# Patient Record
Sex: Female | Born: 1990 | Race: Black or African American | Hispanic: No | Marital: Single | Smoking: Current some day smoker
Health system: Southern US, Community
[De-identification: ages and names within clinical notes are randomized; demographics above are authoritative.]

## PROBLEM LIST (undated history)

## (undated) DIAGNOSIS — D649 Anemia, unspecified: Secondary | ICD-10-CM

## (undated) HISTORY — PX: ANKLE SURGERY: SHX546

---

## 2012-10-05 ENCOUNTER — Encounter (HOSPITAL_COMMUNITY): Payer: Self-pay | Admitting: *Deleted

## 2012-10-05 DIAGNOSIS — G8929 Other chronic pain: Secondary | ICD-10-CM | POA: Insufficient documentation

## 2012-10-05 DIAGNOSIS — D649 Anemia, unspecified: Secondary | ICD-10-CM | POA: Insufficient documentation

## 2012-10-05 DIAGNOSIS — R109 Unspecified abdominal pain: Secondary | ICD-10-CM | POA: Insufficient documentation

## 2012-10-05 DIAGNOSIS — Z3202 Encounter for pregnancy test, result negative: Secondary | ICD-10-CM | POA: Insufficient documentation

## 2012-10-05 DIAGNOSIS — Z79899 Other long term (current) drug therapy: Secondary | ICD-10-CM | POA: Insufficient documentation

## 2012-10-05 DIAGNOSIS — N898 Other specified noninflammatory disorders of vagina: Secondary | ICD-10-CM | POA: Insufficient documentation

## 2012-10-05 LAB — CBC WITH DIFFERENTIAL/PLATELET
Basophils Absolute: 0 10*3/uL (ref 0.0–0.1)
Eosinophils Absolute: 0.1 10*3/uL (ref 0.0–0.7)
Hemoglobin: 10.4 g/dL — ABNORMAL LOW (ref 12.0–15.0)
Lymphocytes Relative: 45 % (ref 12–46)
MCHC: 34 g/dL (ref 30.0–36.0)
Neutrophils Relative %: 47 % (ref 43–77)
RDW: 16.9 % — ABNORMAL HIGH (ref 11.5–15.5)
WBC: 7 10*3/uL (ref 4.0–10.5)

## 2012-10-05 LAB — URINALYSIS, ROUTINE W REFLEX MICROSCOPIC
Glucose, UA: NEGATIVE mg/dL
Protein, ur: 100 mg/dL — AB
Specific Gravity, Urine: 1.031 — ABNORMAL HIGH (ref 1.005–1.030)
Urobilinogen, UA: 1 mg/dL (ref 0.0–1.0)

## 2012-10-05 LAB — COMPREHENSIVE METABOLIC PANEL
ALT: 12 U/L (ref 0–35)
AST: 16 U/L (ref 0–37)
Albumin: 3.4 g/dL — ABNORMAL LOW (ref 3.5–5.2)
Alkaline Phosphatase: 68 U/L (ref 39–117)
Potassium: 3.8 mEq/L (ref 3.5–5.1)
Sodium: 140 mEq/L (ref 135–145)
Total Protein: 6.9 g/dL (ref 6.0–8.3)

## 2012-10-05 LAB — URINE MICROSCOPIC-ADD ON

## 2012-10-05 LAB — PREGNANCY, URINE: Preg Test, Ur: NEGATIVE

## 2012-10-05 NOTE — ED Notes (Signed)
The pt has had vaginal bleeding for 7 months and she has been worked up for the same .  She just moved here 3 weeks ago.   Some nausea with abd pain.  lmp now

## 2012-10-06 ENCOUNTER — Emergency Department (HOSPITAL_COMMUNITY)
Admission: EM | Admit: 2012-10-06 | Discharge: 2012-10-06 | Disposition: A | Discharge status: Home / Self Care | Payer: Medicaid Other | Attending: Emergency Medicine | Admitting: Emergency Medicine

## 2012-10-06 DIAGNOSIS — D649 Anemia, unspecified: Secondary | ICD-10-CM

## 2012-10-06 DIAGNOSIS — R109 Unspecified abdominal pain: Secondary | ICD-10-CM

## 2012-10-06 DIAGNOSIS — N939 Abnormal uterine and vaginal bleeding, unspecified: Secondary | ICD-10-CM

## 2012-10-06 HISTORY — DX: Anemia, unspecified: D64.9

## 2012-10-06 MED ORDER — TRAMADOL HCL 50 MG PO TABS: 50.0000 mg | ORAL_TABLET | Freq: Four times a day (QID) | ORAL | Status: DC | PRN

## 2012-10-06 NOTE — ED Provider Notes (Signed)
Medical screening examination/treatment/procedure(s) were performed by non-physician practitioner and as supervising physician I was immediately available for consultation/collaboration.  Jones Skene, M.D.   Jones Skene, MD 10/06/12 [phone]

## 2012-10-06 NOTE — ED Provider Notes (Signed)
History     CSN: [id number]  Arrival date & time 10/05/12  2215   First MD Initiated Contact with Patient 10/06/12 0316      Chief Complaint  Patient presents with  . Vaginal Bleeding    (Consider location/radiation/quality/duration/timing/severity/associated sxs/prior treatment) HPI Comments: Patient has had vaginal bleeding for the past 7 months.  She has had multiple visits with her OB/GYN doctor and asked for old, [place].  It's been recommended that she get a D&C.  She's been tried on metformin which she will not take because she doesn't like the way.  It makes her feel and she can't remember to take it on a daily basis.  She's been tried on hormone therapy, which she, says makes her feel bad, so she will not take fact, she has pain in her left side.  That shoots up.  She had pain in her right side that shoots up into her right breast and into her back.  This pain has been there for the past 7 months intermittently waxing and waning in intensity.  She recently moved to [place].  3, weeks, ago, plans on establishing with Dr. Deidre Ala.  She, states she's had her medical records sent to him.  Tonight presents to the emergency department, with her chronic pain, not worse or better than in the past, but is having a difficult time sleeping tonight.  She states, that she's tried Tylenol, Advil, and ibuprofen, without any resolution of the pain.  She does take iron tablets for her anemia. She denies any cough, fever, chills, dysuria, constipation, diarrhea, sore throat.  Respiratory illness of any kind  Patient is a 22 y.o. female presenting with vaginal bleeding. The history is provided by the patient.  Vaginal Bleeding This is a chronic problem. The current episode started more than 1 month ago. The problem occurs constantly. The problem has been waxing and waning. Associated symptoms include abdominal pain. Pertinent negatives include no change in bowel habit, chest pain, chills,  congestion, coughing, fever, myalgias, nausea, rash or vomiting. Nothing aggravates the symptoms. She has tried NSAIDs for the symptoms. The treatment provided no relief.    Past Medical History  Diagnosis Date  . Anemia     History reviewed. No pertinent past surgical history.  No family history on file.  History  Substance Use Topics  . Smoking status: Never Smoker   . Smokeless tobacco: Not on file  . Alcohol Use: No    OB History   Grav Para Term Preterm Abortions TAB SAB Ect Mult Living                  Review of Systems  Constitutional: Negative for fever and chills.  HENT: Negative for congestion and rhinorrhea.   Respiratory: Negative for cough, shortness of breath and wheezing.   Cardiovascular: Negative for chest pain, palpitations and leg swelling.  Gastrointestinal: Positive for abdominal pain. Negative for nausea, vomiting, diarrhea, constipation and change in bowel habit.  Genitourinary: Positive for vaginal bleeding. Negative for dysuria, vaginal discharge and vaginal pain.  Musculoskeletal: Negative for myalgias.  Skin: Negative for rash.  Neurological: Negative for dizziness.  All other systems reviewed and are negative.    Allergies  Review of patient's allergies indicates no known allergies.  Home Medications   Current Outpatient Rx  Name  Route  Sig  Dispense  Refill  . ibuprofen (ADVIL,MOTRIN) 200 MG tablet   Oral   Take 200 mg by mouth every 6 (six) hours  as needed for pain.         . IRON PO   Oral   Take 1 tablet by mouth daily.         . traMADol (ULTRAM) 50 MG tablet   Oral   Take 1 tablet (50 mg total) by mouth every 6 (six) hours as needed for pain.   15 tablet   0     BP 129/75  Pulse 84  Temp(Src) 98.5 F (36.9 C)  Resp 20  SpO2 98%  LMP 10/05/2012  Physical Exam  Nursing note and vitals reviewed. Constitutional: She is oriented to person, place, and time. She appears well-developed and well-nourished. No  distress.  Morbidly obese, in no distress  HENT:  Head: Normocephalic and atraumatic.  Eyes: Pupils are equal, round, and reactive to light.  Neck: Normal range of motion.  Cardiovascular: Normal rate and regular rhythm.   Pulmonary/Chest: Effort normal and breath sounds normal.  Abdominal: Soft. She exhibits no distension. There is no tenderness.  Morbidly obese  Musculoskeletal: She exhibits no edema.  Neurological: She is alert and oriented to person, place, and time.  Skin: Skin is warm.    ED Course  Procedures (including critical care time)  Labs Reviewed  URINALYSIS, ROUTINE W REFLEX MICROSCOPIC - Abnormal; Notable for the following:    Color, Urine RED (*)    APPearance TURBID (*)    Specific Gravity, Urine 1.031 (*)    Hgb urine dipstick LARGE (*)    Bilirubin Urine SMALL (*)    Ketones, ur 15 (*)    Protein, ur 100 (*)    Leukocytes, UA SMALL (*)    All other components within normal limits  CBC WITH DIFFERENTIAL - Abnormal; Notable for the following:    Hemoglobin 10.4 (*)    HCT 30.6 (*)    MCV 65.4 (*)    MCH 22.2 (*)    RDW 16.9 (*)    All other components within normal limits  COMPREHENSIVE METABOLIC PANEL - Abnormal; Notable for the following:    Albumin 3.4 (*)    Total Bilirubin 0.1 (*)    All other components within normal limits  URINE MICROSCOPIC-ADD ON - Abnormal; Notable for the following:    Squamous Epithelial / LPF FEW (*)    Bacteria, UA FEW (*)    All other components within normal limits  URINE CULTURE  PREGNANCY, URINE   No results found.   1. Abnormal vaginal bleeding   2. Anemia   3. Abdominal pain       MDM   Patient has no new symptoms, along with her abnormal vaginal bleeding for the past 7 months.  She is taking iron supplement her anemia today.  Her hemoglobin and hematocrit are 10 point 4 and 30.6.  Her urine does not show any sign of infection.  She is not tachycardic or tachypnea.  She has an appointment with Dr.  Excell Seltzer to establish primary care and referred her to [facility] hospital.  Clinic, as well        Arman Filter, NP 10/06/12 0353  Arman Filter, NP 10/06/12 [phone]

## 2012-10-07 LAB — URINE CULTURE: Colony Count: 9000

## 2012-11-12 ENCOUNTER — Encounter (HOSPITAL_COMMUNITY): Payer: Self-pay | Admitting: Emergency Medicine

## 2012-11-12 ENCOUNTER — Emergency Department (HOSPITAL_COMMUNITY)
Admission: EM | Admit: 2012-11-12 | Discharge: 2012-11-13 | Disposition: A | Discharge status: Home / Self Care | Payer: Medicaid Other | Attending: Emergency Medicine | Admitting: Emergency Medicine

## 2012-11-12 DIAGNOSIS — N898 Other specified noninflammatory disorders of vagina: Secondary | ICD-10-CM | POA: Insufficient documentation

## 2012-11-12 DIAGNOSIS — Z79899 Other long term (current) drug therapy: Secondary | ICD-10-CM | POA: Insufficient documentation

## 2012-11-12 DIAGNOSIS — N949 Unspecified condition associated with female genital organs and menstrual cycle: Secondary | ICD-10-CM | POA: Insufficient documentation

## 2012-11-12 DIAGNOSIS — E669 Obesity, unspecified: Secondary | ICD-10-CM | POA: Insufficient documentation

## 2012-11-12 DIAGNOSIS — D649 Anemia, unspecified: Secondary | ICD-10-CM | POA: Insufficient documentation

## 2012-11-12 DIAGNOSIS — G8929 Other chronic pain: Secondary | ICD-10-CM | POA: Insufficient documentation

## 2012-11-12 DIAGNOSIS — N920 Excessive and frequent menstruation with regular cycle: Secondary | ICD-10-CM | POA: Insufficient documentation

## 2012-11-12 DIAGNOSIS — Z3202 Encounter for pregnancy test, result negative: Secondary | ICD-10-CM | POA: Insufficient documentation

## 2012-11-12 DIAGNOSIS — R109 Unspecified abdominal pain: Secondary | ICD-10-CM | POA: Insufficient documentation

## 2012-11-12 LAB — WET PREP, GENITAL

## 2012-11-12 LAB — BASIC METABOLIC PANEL
BUN: 9 mg/dL (ref 6–23)
Chloride: 103 mEq/L (ref 96–112)
Creatinine, Ser: 0.79 mg/dL (ref 0.50–1.10)
GFR calc Af Amer: >90 mL/min (ref >90)

## 2012-11-12 LAB — URINALYSIS, ROUTINE W REFLEX MICROSCOPIC
Bilirubin Urine: NEGATIVE
Glucose, UA: NEGATIVE mg/dL
Hgb urine dipstick: NEGATIVE
Ketones, ur: NEGATIVE mg/dL
Nitrite: NEGATIVE
pH: 5.5 (ref 5.0–8.0)

## 2012-11-12 LAB — CBC
MCH: 21.1 pg — ABNORMAL LOW (ref 26.0–34.0)
MCV: 67.4 fL — ABNORMAL LOW (ref 78.0–100.0)
Platelets: 295 10*3/uL (ref 150–400)
RBC: 4.7 MIL/uL (ref 3.87–5.11)

## 2012-11-12 MED ORDER — OXYCODONE-ACETAMINOPHEN 5-325 MG PO TABS
2.0000 | ORAL_TABLET | Freq: Once | ORAL | Status: AC
  Administered 2012-11-12: 2 via ORAL
  Filled 2012-11-12: qty 2

## 2012-11-12 NOTE — ED Provider Notes (Signed)
History     CSN: [id number]  Arrival date & time 11/12/12  2206   First MD Initiated Contact with Patient 11/12/12 2228      Chief Complaint  Patient presents with  . Vaginal Bleeding  . Abdominal Pain    (Consider location/radiation/quality/duration/timing/severity/associated sxs/prior treatment) The history is provided by the patient and medical records. No language interpreter was used.    Meghan Edwards is a 22 y.o. female  with a hx of vaginal bleeding for the last 8 months with chronic associated abdominal pain presents to the Emergency Department complaining of persistent, vaginal bleeding and abdominal pain with an acute worsening tonight after lifting boxes at her work.  Patient states she seen her OB/GYN multiple times who has recommended a D&C, metformin and hormone therapy none of which she wants.  She states the metformin and hormone therapy make her feel poorly. She states the abdominal pain radiates from her pelvis and her back and is constant in nature waxing and waning in intensity.  Nothing makes her pain better and movement and stress activity make it worse. She states she does take iron tablets for her chronic anemia. She denies fever, chills, headache, neck pain, chest pain, shortness of breath, nausea, vomiting, diarrhea, constipation, dysuria.   Patient states her pain is the same as it has been and has not changed in nature, but it has increased in intensity.  Past Medical History  Diagnosis Date  . Anemia     Past Surgical History  Procedure Laterality Date  . Ankle surgery      Family History  Problem Relation Age of Onset  . Hypertension Other   . Diabetes Other   . Stroke Other     History  Substance Use Topics  . Smoking status: Never Smoker   . Smokeless tobacco: Not on file  . Alcohol Use: No    OB History   Grav Para Term Preterm Abortions TAB SAB Ect Mult Living                  Review of Systems  Constitutional: Negative for fever,  diaphoresis, appetite change, fatigue and unexpected weight change.  HENT: Negative for mouth sores, trouble swallowing, neck pain and neck stiffness.   Respiratory: Negative for cough, chest tightness, shortness of breath, wheezing and stridor.   Cardiovascular: Negative for chest pain and palpitations.  Gastrointestinal: Positive for abdominal pain. Negative for nausea, vomiting, diarrhea, constipation, blood in stool, abdominal distention and rectal pain.  Genitourinary: Positive for vaginal bleeding and pelvic pain. Negative for dysuria, urgency, frequency, hematuria, flank pain, decreased urine volume, vaginal discharge, difficulty urinating and vaginal pain.  Musculoskeletal: Negative for back pain.  Skin: Negative for rash.  Neurological: Negative for weakness.  Hematological: Negative for adenopathy.  Psychiatric/Behavioral: Negative for confusion.  All other systems reviewed and are negative.    Allergies  Review of patient's allergies indicates no known allergies.  Home Medications  No current outpatient prescriptions on file.  BP 138/81  Pulse 86  Temp(Src) 98.8 F (37.1 C) (Oral)  Resp 16  Ht 5\' 4"  (1.626 m)  Wt 314 lb (142.429 kg)  BMI 53.87 kg/m2  SpO2 98%  Physical Exam  Nursing note and vitals reviewed. Constitutional: She is oriented to person, place, and time. She appears well-developed and well-nourished. No distress.  HENT:  Head: Normocephalic and atraumatic.  Mouth/Throat: Oropharynx is clear and moist.  Eyes: Conjunctivae are normal. Pupils are equal, round, and reactive to light. No  scleral icterus.  Neck: Normal range of motion. Neck supple.  Cardiovascular: Normal rate, regular rhythm, normal heart sounds and intact distal pulses.   No murmur heard. Pulmonary/Chest: Effort normal and breath sounds normal. No respiratory distress. She has no wheezes.  Abdominal: Soft. Normal appearance and bowel sounds are normal. She exhibits no distension and no  mass. There is no hepatosplenomegaly. There is tenderness in the right lower quadrant, suprapubic area and left lower quadrant. There is no rigidity, no rebound, no guarding, no CVA tenderness, no tenderness at McBurney's point and negative Murphy's sign. Hernia confirmed negative in the right inguinal area and confirmed negative in the left inguinal area.    Obese  Genitourinary: Uterus normal. Pelvic exam was performed with patient supine. There is no rash, tenderness or lesion on the right labia. There is no rash, tenderness or lesion on the left labia. Uterus is not deviated, not enlarged, not fixed and not tender. Cervix exhibits no motion tenderness and no friability. Discharge: mucous  Right adnexum displays no mass, no tenderness and no fullness. Left adnexum displays no mass, no tenderness and no fullness. There is bleeding around the vagina. No erythema or tenderness around the vagina. No foreign body around the vagina. No signs of injury around the vagina. No vaginal discharge found.  Musculoskeletal: Normal range of motion. She exhibits no edema.  Lymphadenopathy:    She has no cervical adenopathy.       Right: No inguinal adenopathy present.       Left: No inguinal adenopathy present.  Neurological: She is alert and oriented to person, place, and time. She exhibits normal muscle tone. Coordination normal.  Speech is clear and goal oriented Moves extremities without ataxia  Skin: Skin is warm and dry. No rash noted. She is not diaphoretic.  Psychiatric: She has a normal mood and affect.    ED Course  Procedures (including critical care time)  Labs Reviewed  WET PREP, GENITAL - Abnormal; Notable for the following:    Clue Cells Wet Prep HPF POC RARE (*)    WBC, Wet Prep HPF POC FEW (*)    All other components within normal limits  CBC - Abnormal; Notable for the following:    Hemoglobin 9.9 (*)    HCT 31.7 (*)    MCV 67.4 (*)    MCH 21.1 (*)    RDW 17.3 (*)    All other  components within normal limits  URINALYSIS, ROUTINE W REFLEX MICROSCOPIC - Abnormal; Notable for the following:    Specific Gravity, Urine 1.031 (*)    All other components within normal limits  GC/CHLAMYDIA PROBE AMP  PREGNANCY, URINE  BASIC METABOLIC PANEL   No results found.   1. Chronic abdominal pain   2. Menorrhagia       MDM  Maelie Chriswell presents with chronic abdominal pain and vaginal bleeding. Pt with hx of same in the past and only treating the anemia.  Patient is nontoxic, nonseptic appearing, in no apparent distress.  Patient's pain and other symptoms adequately managed in emergency department.  Labs, imaging vitals reviewed; I do not believe imaging is warranted at this time.  Patient does not meet the SIRS or Sepsis criteria.  On repeat exam patient does not have a surgical abdomin and there are nor peritoneal signs.  No indication of appendicitis, bowel obstruction, bowel perforation, cholecystitis, diverticulitis, PID or ectopic pregnancy.  Patient discharged home with strict instructions for follow-up with their primary care physician and OB/GYN.  I have also discussed reasons to return immediately to the ER.  Patient expresses understanding and agrees with plan.  Dahlia Client Elridge Stemm, PA-C 11/13/12 [phone]

## 2012-11-12 NOTE — ED Notes (Signed)
Pt reports having vaginal bleeding, back pain, and abd pain for the past 8 months  Pt states tonight at work she was lifting heavy boxes and now the pain is unbearable

## 2012-11-13 LAB — GC/CHLAMYDIA PROBE AMP
CT Probe RNA: NEGATIVE
GC Probe RNA: NEGATIVE

## 2012-11-15 NOTE — ED Provider Notes (Signed)
Medical screening examination/treatment/procedure(s) were performed by non-physician practitioner and as supervising physician I was immediately available for consultation/collaboration.   Onalee Steinbach R Hansini Clodfelter, MD 11/15/12 1510 

## 2013-01-01 ENCOUNTER — Ambulatory Visit (INDEPENDENT_AMBULATORY_CARE_PROVIDER_SITE_OTHER): Payer: Medicaid Other | Admitting: Obstetrics

## 2013-01-01 ENCOUNTER — Encounter: Payer: Self-pay | Admitting: Obstetrics

## 2013-01-01 VITALS — BP 132/82 | HR 71 | Temp 98.2°F | Ht 65.0 in | Wt 311.0 lb

## 2013-01-01 DIAGNOSIS — Z0000 Encounter for general adult medical examination without abnormal findings: Secondary | ICD-10-CM

## 2013-01-01 DIAGNOSIS — D649 Anemia, unspecified: Secondary | ICD-10-CM

## 2013-01-01 DIAGNOSIS — Z3009 Encounter for other general counseling and advice on contraception: Secondary | ICD-10-CM

## 2013-01-01 DIAGNOSIS — Z3202 Encounter for pregnancy test, result negative: Secondary | ICD-10-CM

## 2013-01-01 DIAGNOSIS — Z113 Encounter for screening for infections with a predominantly sexual mode of transmission: Secondary | ICD-10-CM

## 2013-01-01 LAB — CBC
MCHC: 31.8 g/dL (ref 30.0–36.0)
Platelets: 349 10*3/uL (ref 150–400)
RDW: 17.8 % — ABNORMAL HIGH (ref 11.5–15.5)

## 2013-01-01 LAB — COMPREHENSIVE METABOLIC PANEL
AST: 14 U/L (ref 0–37)
Albumin: 4 g/dL (ref 3.5–5.2)
Alkaline Phosphatase: 61 U/L (ref 39–117)
Potassium: 4.2 mEq/L (ref 3.5–5.3)
Sodium: 138 mEq/L (ref 135–145)
Total Bilirubin: 0.3 mg/dL (ref 0.3–1.2)
Total Protein: 6.7 g/dL (ref 6.0–8.3)

## 2013-01-01 LAB — POCT URINE PREGNANCY: Preg Test, Ur: NEGATIVE

## 2013-01-01 MED ORDER — VITAFOL ULTRA 29-0.6-0.4-200 MG PO CAPS: 1.0000 | ORAL_CAPSULE | Freq: Every day | ORAL | Status: DC

## 2013-01-01 MED ORDER — MEDROXYPROGESTERONE ACETATE 150 MG/ML IM SUSP: 150.0000 mg | INTRAMUSCULAR | Status: DC

## 2013-01-01 NOTE — Progress Notes (Signed)
.   Subjective:     Meghan Edwards is a 22 y.o. female here for a routine exam.  Current complaints: Patient states that she had an abortion at the beginning of the year and has been bleeding everyday since.   Personal health questionnaire reviewed: yes.   Gynecologic History No LMP recorded. - Patient states that she has been bleeding everyday since January. Contraception: none Last Pap: N/A Last mammogram: N/A  Obstetric History OB History   Grav Para Term Preterm Abortions TAB SAB Ect Mult Living                   The following portions of the patient's history were reviewed and updated as appropriate: allergies, current medications, past family history, past medical history, past social history, past surgical history and problem list.  Review of Systems Pertinent items are noted in HPI.    Objective:    General appearance: alert and no distress Abdomen: normal findings: soft, non-tender Pelvic: cervix normal in appearance, external genitalia normal, no adnexal masses or tenderness, no cervical motion tenderness, uterus normal size, shape, and consistency and vagina normal without discharge    Assessment:    Healthy female exam.  Contraceptive counseling.   Plan:    Education reviewed: Contraceptive options. Contraception: none. Follow up in: 2 weeks. Depo Provera Rx.  Will RTC in 2 weeks for Depo injection after 2 weeks of abstinence and negative UPT.

## 2013-01-02 LAB — GC/CHLAMYDIA PROBE AMP
CT Probe RNA: NEGATIVE
GC Probe RNA: NEGATIVE

## 2013-01-02 LAB — PAP IG W/ RFLX HPV ASCU

## 2013-01-02 LAB — WET PREP BY MOLECULAR PROBE
Gardnerella vaginalis: POSITIVE — AB
Trichomonas vaginosis: NEGATIVE

## 2013-01-06 ENCOUNTER — Other Ambulatory Visit: Payer: Self-pay | Admitting: *Deleted

## 2013-01-06 MED ORDER — METRONIDAZOLE 500 MG PO TABS: 500.0000 mg | ORAL_TABLET | Freq: Two times a day (BID) | ORAL | Status: DC

## 2013-01-12 ENCOUNTER — Ambulatory Visit: Payer: Medicaid Other

## 2013-01-16 ENCOUNTER — Ambulatory Visit (INDEPENDENT_AMBULATORY_CARE_PROVIDER_SITE_OTHER): Payer: Medicaid Other | Admitting: *Deleted

## 2013-01-16 VITALS — BP 123/84 | HR 85 | Temp 98.4°F | Ht 64.0 in | Wt 317.6 lb

## 2013-01-16 DIAGNOSIS — Z3202 Encounter for pregnancy test, result negative: Secondary | ICD-10-CM

## 2013-01-16 DIAGNOSIS — Z309 Encounter for contraceptive management, unspecified: Secondary | ICD-10-CM

## 2013-01-16 DIAGNOSIS — IMO0001 Reserved for inherently not codable concepts without codable children: Secondary | ICD-10-CM

## 2013-01-16 MED ORDER — MEDROXYPROGESTERONE ACETATE 150 MG/ML IM SUSP
150.0000 mg | INTRAMUSCULAR | Status: AC
  Administered 2013-01-16: 150 mg via INTRAMUSCULAR

## 2013-01-16 NOTE — Progress Notes (Signed)
Patient is here today for her Depo injection.  Second UPT is negative.  Patient tolerated well and was advised to RTO for next injection 04/09/13.

## 2013-01-19 ENCOUNTER — Ambulatory Visit: Payer: Medicaid Other

## 2013-02-14 ENCOUNTER — Emergency Department (HOSPITAL_COMMUNITY)
Admission: EM | Admit: 2013-02-14 | Discharge: 2013-02-14 | Disposition: A | Discharge status: Home / Self Care | Payer: Medicaid Other | Attending: Emergency Medicine | Admitting: Emergency Medicine

## 2013-02-14 ENCOUNTER — Encounter (HOSPITAL_COMMUNITY): Payer: Self-pay | Admitting: Emergency Medicine

## 2013-02-14 DIAGNOSIS — N739 Female pelvic inflammatory disease, unspecified: Secondary | ICD-10-CM | POA: Insufficient documentation

## 2013-02-14 DIAGNOSIS — D649 Anemia, unspecified: Secondary | ICD-10-CM | POA: Insufficient documentation

## 2013-02-14 DIAGNOSIS — Z3202 Encounter for pregnancy test, result negative: Secondary | ICD-10-CM | POA: Insufficient documentation

## 2013-02-14 DIAGNOSIS — G8929 Other chronic pain: Secondary | ICD-10-CM | POA: Insufficient documentation

## 2013-02-14 DIAGNOSIS — N760 Acute vaginitis: Secondary | ICD-10-CM

## 2013-02-14 DIAGNOSIS — N949 Unspecified condition associated with female genital organs and menstrual cycle: Secondary | ICD-10-CM | POA: Insufficient documentation

## 2013-02-14 DIAGNOSIS — R109 Unspecified abdominal pain: Secondary | ICD-10-CM | POA: Insufficient documentation

## 2013-02-14 DIAGNOSIS — Z79899 Other long term (current) drug therapy: Secondary | ICD-10-CM | POA: Insufficient documentation

## 2013-02-14 LAB — CBC WITH DIFFERENTIAL/PLATELET
Basophils Absolute: 0 10*3/uL (ref 0.0–0.1)
Basophils Relative: 0 % (ref 0–1)
Eosinophils Relative: 2 % (ref 0–5)
HCT: 33.1 % — ABNORMAL LOW (ref 36.0–46.0)
MCHC: 33.2 g/dL (ref 30.0–36.0)
MCV: 66.3 fL — ABNORMAL LOW (ref 78.0–100.0)
Monocytes Absolute: 0.3 10*3/uL (ref 0.1–1.0)
RDW: 16.5 % — ABNORMAL HIGH (ref 11.5–15.5)

## 2013-02-14 LAB — URINALYSIS, ROUTINE W REFLEX MICROSCOPIC
Glucose, UA: NEGATIVE mg/dL
Ketones, ur: 15 mg/dL — AB
Leukocytes, UA: NEGATIVE
Protein, ur: 30 mg/dL — AB
Urobilinogen, UA: 0.2 mg/dL (ref 0.0–1.0)

## 2013-02-14 LAB — BASIC METABOLIC PANEL
BUN: 7 mg/dL (ref 6–23)
CO2: 23 mEq/L (ref 19–32)
Calcium: 8.7 mg/dL (ref 8.4–10.5)
Chloride: 105 mEq/L (ref 96–112)
Creatinine, Ser: 0.73 mg/dL (ref 0.50–1.10)
GFR calc Af Amer: >90 mL/min (ref >90)

## 2013-02-14 LAB — URINE MICROSCOPIC-ADD ON

## 2013-02-14 MED ORDER — KETOROLAC TROMETHAMINE 60 MG/2ML IM SOLN
60.0000 mg | Freq: Once | INTRAMUSCULAR | Status: AC
  Administered 2013-02-14: 60 mg via INTRAMUSCULAR
  Filled 2013-02-14: qty 2

## 2013-02-14 MED ORDER — TRAMADOL HCL 50 MG PO TABS: 50.0000 mg | ORAL_TABLET | Freq: Four times a day (QID) | ORAL | Status: DC | PRN

## 2013-02-14 MED ORDER — LIDOCAINE HCL (PF) 1 % IJ SOLN
INTRAMUSCULAR | Status: AC
  Administered 2013-02-14: 2.1 mL
  Filled 2013-02-14: qty 5

## 2013-02-14 MED ORDER — AZITHROMYCIN 1 G PO PACK
1.0000 g | PACK | Freq: Once | ORAL | Status: AC
  Administered 2013-02-14: 1 g via ORAL
  Filled 2013-02-14: qty 1

## 2013-02-14 MED ORDER — CEFTRIAXONE SODIUM 250 MG IJ SOLR
250.0000 mg | Freq: Once | INTRAMUSCULAR | Status: AC
  Administered 2013-02-14: 250 mg via INTRAMUSCULAR
  Filled 2013-02-14: qty 250

## 2013-02-14 NOTE — ED Notes (Signed)
Pt has ride home.

## 2013-02-14 NOTE — ED Notes (Signed)
Pt with hx of chronic abdominal pain and abnormal bleeding.  Pt states she got a Depo-Provera injection 8/8 and stopped bleeding 3 days later.  Yesterday when she used the bathroom and wiped there was brownish-red blood on the toilet paper.  Pt states there is no blood today.  Pt c/o abdominal pain.

## 2013-02-14 NOTE — ED Notes (Signed)
Pt. Stated, I have issues with the Depo shot Aug. 4, 2014.  I've bleeding off and On since the shot.

## 2013-02-14 NOTE — ED Provider Notes (Signed)
CSN: [id number]     Arrival date & time 02/14/13  0931 History   First MD Initiated Contact with Patient 02/14/13 [phone]     Chief Complaint  Patient presents with  . Vaginal Bleeding  . Abdominal Pain   (Consider location/radiation/quality/duration/timing/severity/associated sxs/prior Treatment) HPI Comments: Patient is a G24 P27 22 year old female past medical history significant for chronic abdominal and pelvic pain, anemia presented to the emergency department for flareup of chronic abdominal and pelvic pain. Patient states her pain is not new or atypical from chronic pain stating it is waxing and waning stabbing in nature, 8/10. No alleviating or aggravating factors. Patient states she has had one episode of vaginal bleeding 2-3 days ago since receiving the Depo shot August 4th. Patient states her vaginal bleeding has stopped since then and has not returned. No concern for STIs or pregnancy currently.    Past Medical History  Diagnosis Date  . Anemia    Past Surgical History  Procedure Laterality Date  . Ankle surgery     Family History  Problem Relation Age of Onset  . Hypertension Other   . Diabetes Other   . Stroke Other    History  Substance Use Topics  . Smoking status: Never Smoker   . Smokeless tobacco: Not on file  . Alcohol Use: No   OB History   Grav Para Term Preterm Abortions TAB SAB Ect Mult Living                 Review of Systems  Constitutional: Negative for fever.  Gastrointestinal: Positive for abdominal pain.  Genitourinary: Positive for pelvic pain. Negative for vaginal bleeding and vaginal discharge.  All other systems reviewed and are negative.    Allergies  Review of patient's allergies indicates no known allergies.  Home Medications   Current Outpatient Rx  Name  Route  Sig  Dispense  Refill  . medroxyPROGESTERone (DEPO-PROVERA) 150 MG/ML injection   Intramuscular   Inject 1 mL (150 mg total) into the muscle every 3 (three) months.   1  mL   0   . Prenat-Fe Poly-Methfol-FA-DHA (VITAFOL ULTRA) 29-0.6-0.4-200 MG CAPS   Oral   Take 1 capsule by mouth daily before breakfast.   30 capsule   11   . traMADol (ULTRAM) 50 MG tablet   Oral   Take 1 tablet (50 mg total) by mouth every 6 (six) hours as needed for pain.   15 tablet   0    BP 119/79  Pulse 84  Temp(Src) 98.8 F (37.1 C) (Oral)  Resp 18  SpO2 98% Physical Exam  Constitutional: She is oriented to person, place, and time. She appears well-developed and well-nourished. No distress.  HENT:  Head: Normocephalic and atraumatic.  Right Ear: External ear normal.  Left Ear: External ear normal.  Nose: Nose normal.  Mouth/Throat: Oropharynx is clear and moist.  Eyes: Conjunctivae are normal.  Neck: Neck supple.  Cardiovascular: Normal rate, regular rhythm, normal heart sounds and intact distal pulses.   Abdominal: Soft. Bowel sounds are normal. She exhibits no distension. There is tenderness. There is no rigidity, no rebound, no guarding and no CVA tenderness.    Musculoskeletal: Normal range of motion.  Neurological: She is alert and oriented to person, place, and time.  Skin: Skin is warm and dry. She is not diaphoretic.   Exam performed by Francee Piccolo L,  exam chaperoned Date: 02/14/2013 Pelvic exam: normal external genitalia without evidence of trauma. VULVA: normal appearing vulva  with no masses, tenderness or lesion. VAGINA: normal appearing vagina with normal color and discharge, no lesions. CERVIX: normal appearing cervix without lesions, cervical motion tenderness absent, cervical os closed with out purulent discharge; vaginal discharge - milky and thin, Wet prep and DNA probe for chlamydia and GC obtained.   ADNEXA: normal adnexa in size, nontender and no masses UTERUS: uterus is normal size, shape, consistency and nontender.    ED Course  Procedures (including critical care time)  Medications  ketorolac (TORADOL) injection 60 mg (60  mg Intramuscular Given 02/14/13 1042)  cefTRIAXone (ROCEPHIN) injection 250 mg (250 mg Intramuscular Given 02/14/13 1444)  azithromycin (ZITHROMAX) powder 1 g (1 g Oral Given 02/14/13 1443)  lidocaine (PF) (XYLOCAINE) 1 % injection (2.1 mLs  Given 02/14/13 1445)    Labs Review Labs Reviewed  WET PREP, GENITAL - Abnormal; Notable for the following:    WBC, Wet Prep HPF POC MANY (*)    All other components within normal limits  URINALYSIS, ROUTINE W REFLEX MICROSCOPIC - Abnormal; Notable for the following:    Color, Urine AMBER (*)    APPearance CLOUDY (*)    Specific Gravity, Urine 1.036 (*)    Ketones, ur 15 (*)    Protein, ur 30 (*)    All other components within normal limits  CBC WITH DIFFERENTIAL - Abnormal; Notable for the following:    Hemoglobin 11.0 (*)    HCT 33.1 (*)    MCV 66.3 (*)    MCH 22.0 (*)    RDW 16.5 (*)    All other components within normal limits  URINE MICROSCOPIC-ADD ON - Abnormal; Notable for the following:    Squamous Epithelial / LPF FEW (*)    All other components within normal limits  GC/CHLAMYDIA PROBE AMP  PREGNANCY, URINE  BASIC METABOLIC PANEL   Imaging Review No results found.  MDM   1. Vaginal infection   2. Chronic abdominal pain      Afebrile, NAD, non-toxic appearing, AAOx4. Patient is nontoxic, nonseptic appearing, in no apparent distress.  Patient's pain and other symptoms adequately managed in emergency department.  PO fluids tolerated in ED.  Labs and vitals reviewed.  Patient does not meet the SIRS or Sepsis criteria.  On repeat exam patient does not have a surgical abdomen and there are nor peritoneal signs.  No indication of appendicitis, bowel obstruction, bowel perforation, cholecystitis, diverticulitis, PID or ectopic pregnancy.  No need for imaging as no new or changing symptoms from patient chronic pain along with pain examination and laboratory findings. Patient discharged home with symptomatic treatment and given strict  instructions for follow-up with their OB/GYN.  I have also discussed reasons to return immediately to the ER.  Patient expresses understanding and agrees with plan. Patient d/w with Dr. Freida Busman, agrees with plan.          Jeannetta Ellis, PA-C 02/14/13 1650

## 2013-02-15 NOTE — ED Provider Notes (Signed)
Medical screening examination/treatment/procedure(s) were performed by non-physician practitioner and as supervising physician I was immediately available for consultation/collaboration.  Haydn Cush T Masayoshi Couzens, MD 02/15/13 2040 

## 2013-02-16 LAB — GC/CHLAMYDIA PROBE AMP: GC Probe RNA: NEGATIVE

## 2013-02-17 ENCOUNTER — Emergency Department (HOSPITAL_COMMUNITY)
Admission: EM | Admit: 2013-02-17 | Discharge: 2013-02-17 | Disposition: A | Discharge status: Home / Self Care | Payer: Medicaid Other | Attending: Emergency Medicine | Admitting: Emergency Medicine

## 2013-02-17 ENCOUNTER — Encounter (HOSPITAL_COMMUNITY): Payer: Self-pay | Admitting: Emergency Medicine

## 2013-02-17 DIAGNOSIS — N949 Unspecified condition associated with female genital organs and menstrual cycle: Secondary | ICD-10-CM | POA: Insufficient documentation

## 2013-02-17 DIAGNOSIS — R1032 Left lower quadrant pain: Secondary | ICD-10-CM | POA: Insufficient documentation

## 2013-02-17 DIAGNOSIS — D649 Anemia, unspecified: Secondary | ICD-10-CM | POA: Insufficient documentation

## 2013-02-17 DIAGNOSIS — N938 Other specified abnormal uterine and vaginal bleeding: Secondary | ICD-10-CM | POA: Insufficient documentation

## 2013-02-17 DIAGNOSIS — G8929 Other chronic pain: Secondary | ICD-10-CM

## 2013-02-17 DIAGNOSIS — Z79899 Other long term (current) drug therapy: Secondary | ICD-10-CM | POA: Insufficient documentation

## 2013-02-17 DIAGNOSIS — Z3202 Encounter for pregnancy test, result negative: Secondary | ICD-10-CM | POA: Insufficient documentation

## 2013-02-17 LAB — BASIC METABOLIC PANEL
CO2: 23 mEq/L (ref 19–32)
Calcium: 8.8 mg/dL (ref 8.4–10.5)
Chloride: 106 mEq/L (ref 96–112)
Sodium: 139 mEq/L (ref 135–145)

## 2013-02-17 LAB — POCT PREGNANCY, URINE: Preg Test, Ur: NEGATIVE

## 2013-02-17 LAB — CBC WITH DIFFERENTIAL/PLATELET
Eosinophils Relative: 3 % (ref 0–5)
Monocytes Absolute: 0.3 10*3/uL (ref 0.1–1.0)
Monocytes Relative: 4 % (ref 3–12)
Neutrophils Relative %: 59 % (ref 43–77)
Platelets: 296 10*3/uL (ref 150–400)
RBC: 4.92 MIL/uL (ref 3.87–5.11)
WBC: 6.6 10*3/uL (ref 4.0–10.5)

## 2013-02-17 MED ORDER — HYDROCODONE-ACETAMINOPHEN 5-325 MG PO TABS
1.0000 | ORAL_TABLET | Freq: Once | ORAL | Status: AC
  Administered 2013-02-17: 1 via ORAL
  Filled 2013-02-17: qty 1

## 2013-02-17 MED ORDER — KETOROLAC TROMETHAMINE 60 MG/2ML IM SOLN
30.0000 mg | Freq: Once | INTRAMUSCULAR | Status: AC
  Administered 2013-02-17: 30 mg via INTRAMUSCULAR
  Filled 2013-02-17: qty 2

## 2013-02-17 MED ORDER — HYDROCODONE-ACETAMINOPHEN 5-325 MG PO TABS: ORAL_TABLET | ORAL | Status: DC

## 2013-02-17 NOTE — ED Notes (Signed)
Pt c/o LLQ pain and vaginal bleeding that started about an hour ago.  Has had trouble with her periods before (at one point having a period that lasted 10 months).  Has been on the depo shot.  Went to [facility] over the weekend for abd pain but not vaginal bleeding.  States she is hemorrhaging.  Went through one pad since the bleeding started.

## 2013-02-17 NOTE — ED Provider Notes (Signed)
CSN: [id number]     Arrival date & time 02/17/13  1701 History   First MD Initiated Contact with Patient 02/17/13 1720     Chief Complaint  Patient presents with  . Abdominal Pain  . Vaginal Bleeding   (Consider location/radiation/quality/duration/timing/severity/associated sxs/prior Treatment) HPI  Meghan Edwards is a 22 y.o. female with PMH significant for chronic abdominal and pelvic pain c/o vaginal bleeding onset 3 days ago with worsening left lower quadrant abdominal pain. Patient was seen for similar symptoms 3 days ago [place], wet prep showed significant white blood cells and patient was given Rocephin 250 mg IM and azithromycin 1 g by mouth. Resulting GC and Chlamydia was negative. Patient describes her pain as feeling like her ovaries being twisted, rated a 10 out of 10, no exacerbating or alleviating factors identified. This is typical for her prior exacerbations which she has had for 3 years. Patient was given a prescription for tramadol 3 days ago but has not had time to fill it. She has been taking Motrin at home with little relief. Pt has h/o DUB and is taking depo shot for menstruation regulation and birth control (last dose august 4 started in 06/2008). Patient has been through one pad today. She states that she feels like she is hemorrhaging. Patient denies abnormal vaginal discharge, dysuria, urinary frequency, nausea vomiting, fever, dyspareunia, chest pain, palpitations, shortness of breath.      Past Medical History  Diagnosis Date  . Anemia    Past Surgical History  Procedure Laterality Date  . Ankle surgery     Family History  Problem Relation Age of Onset  . Hypertension Other   . Diabetes Other   . Stroke Other    History  Substance Use Topics  . Smoking status: Never Smoker   . Smokeless tobacco: Not on file  . Alcohol Use: No   OB History   Grav Para Term Preterm Abortions TAB SAB Ect Mult Living                 Review of Systems 10 systems  reviewed and found to be negative, except as noted in the HPI.  Allergies  Review of patient's allergies indicates no known allergies.  Home Medications   Current Outpatient Rx  Name  Route  Sig  Dispense  Refill  . aspirin EC 325 MG tablet   Oral   Take 325 mg by mouth every 6 (six) hours as needed for pain (pain).         . Prenat-Fe Poly-Methfol-FA-DHA (VITAFOL ULTRA) 29-0.6-0.4-200 MG CAPS   Oral   Take 1 capsule by mouth daily before breakfast.   30 capsule   11   . traMADol (ULTRAM) 50 MG tablet   Oral   Take 1 tablet (50 mg total) by mouth every 6 (six) hours as needed for pain.   15 tablet   0   . medroxyPROGESTERone (DEPO-PROVERA) 150 MG/ML injection   Intramuscular   Inject 1 mL (150 mg total) into the muscle every 3 (three) months.   1 mL   0    BP 145/69  Pulse 77  Temp(Src) 98.6 F (37 C) (Oral)  Resp 20  SpO2 100%  LMP 01/12/2013 Physical Exam  Nursing note and vitals reviewed. Constitutional: She is oriented to person, place, and time. She appears well-developed and well-nourished. No distress.  Appears uncomfortable  HENT:  Head: Normocephalic.  Eyes: Conjunctivae and EOM are normal.  Cardiovascular: Normal rate.   Pulmonary/Chest:  Effort normal and breath sounds normal. No stridor. No respiratory distress. She has no wheezes. She has no rales. She exhibits no tenderness.  Abdominal: Soft. Bowel sounds are normal. She exhibits no distension and no mass. There is tenderness. There is no rebound and no guarding.  Tender to palpation left lower quadrant, voluntary guarding with no rebound.  Musculoskeletal: Normal range of motion.  Neurological: She is alert and oriented to person, place, and time.  Psychiatric: She has a normal mood and affect.   7:21 PM patient seen and reexamined at the bedside, she is resting comfortably watching TV. Serial abdominal exam remains nonsurgical, she is much less tender to palpation in the left lower quadrant after  demonstration of Toradol.    ED Course  Procedures (including critical care time) Labs Review Labs Reviewed  CBC WITH DIFFERENTIAL - Abnormal; Notable for the following:    Hemoglobin 10.8 (*)    HCT 32.5 (*)    MCV 66.1 (*)    MCH 22.0 (*)    RDW 16.7 (*)    All other components within normal limits  BASIC METABOLIC PANEL - Abnormal; Notable for the following:    Potassium 3.2 (*)    Glucose, Bld 103 (*)    All other components within normal limits  POCT PREGNANCY, URINE   Imaging Review No results found.  MDM   1. Chronic abdominal pain   2. DUB (dysfunctional uterine bleeding)     Filed Vitals:   02/17/13 1711  BP: 145/69  Pulse: 77  Temp: 98.6 F (37 C)  TempSrc: Oral  Resp: 20  SpO2: 100%     Meghan Edwards is a 22 y.o. female exacerbation of chronic abdominal pain. Serial abdominal exams remain nonsurgical. Vital signs within normal limits, patient's pain is much improved with Toradol. Blood work and urine pregnancy normal. I advised the patient would be very good if she failed her outpatient pain medication.  Medications  ketorolac (TORADOL) injection 30 mg (30 mg Intramuscular Given 02/17/13 1818)  HYDROcodone-acetaminophen (NORCO/VICODIN) 5-325 MG per tablet 1 tablet (1 tablet Oral Given 02/17/13 1945)    Pt is hemodynamically stable, appropriate for, and amenable to discharge at this time. Pt verbalized understanding and agrees with care plan. All questions answered. Outpatient follow-up and specific return precautions discussed.    New Prescriptions   HYDROCODONE-ACETAMINOPHEN (NORCO/VICODIN) 5-325 MG PER TABLET    Take 1-2 tablets by mouth every 6 hours as needed for pain.    Note: Portions of this report may have been transcribed using voice recognition software. Every effort was made to ensure accuracy; however, inadvertent computerized transcription errors may be present      Wynetta Emery, PA-C 02/17/13 2003  Medical screening  examination/treatment/procedure(s) were conducted as a shared visit with non-physician practitioner(s) or resident  and myself.  I personally evaluated the patient during the encounter and agree with the findings and plan unless otherwise indicated.    Well appearing.  Abd pain and bleeding similar to multiple previous episodes.  Abd mild lower abd tender, no guarding, mmm, well appearing.  Labs unremarkable. Fup discussed.   HypoK, chronic abd pain  Enid Skeens, MD 02/18/13 [phone]

## 2013-02-20 ENCOUNTER — Encounter: Payer: Self-pay | Admitting: Obstetrics & Gynecology

## 2013-03-23 ENCOUNTER — Ambulatory Visit: Payer: Medicaid Other | Admitting: Obstetrics

## 2013-04-09 ENCOUNTER — Ambulatory Visit: Payer: Medicaid Other

## 2013-04-20 ENCOUNTER — Encounter (HOSPITAL_COMMUNITY): Payer: Self-pay | Admitting: Emergency Medicine

## 2013-04-20 ENCOUNTER — Emergency Department (HOSPITAL_COMMUNITY)
Admission: EM | Admit: 2013-04-20 | Discharge: 2013-04-20 | Disposition: A | Discharge status: Home / Self Care | Payer: Medicaid Other | Attending: Emergency Medicine | Admitting: Emergency Medicine

## 2013-04-20 ENCOUNTER — Emergency Department (HOSPITAL_COMMUNITY): Payer: Medicaid Other

## 2013-04-20 DIAGNOSIS — N938 Other specified abnormal uterine and vaginal bleeding: Secondary | ICD-10-CM | POA: Insufficient documentation

## 2013-04-20 DIAGNOSIS — Z3202 Encounter for pregnancy test, result negative: Secondary | ICD-10-CM | POA: Insufficient documentation

## 2013-04-20 DIAGNOSIS — R110 Nausea: Secondary | ICD-10-CM | POA: Insufficient documentation

## 2013-04-20 DIAGNOSIS — R102 Pelvic and perineal pain: Secondary | ICD-10-CM

## 2013-04-20 DIAGNOSIS — Z79899 Other long term (current) drug therapy: Secondary | ICD-10-CM | POA: Insufficient documentation

## 2013-04-20 DIAGNOSIS — N949 Unspecified condition associated with female genital organs and menstrual cycle: Secondary | ICD-10-CM | POA: Insufficient documentation

## 2013-04-20 DIAGNOSIS — D649 Anemia, unspecified: Secondary | ICD-10-CM | POA: Insufficient documentation

## 2013-04-20 LAB — CBC WITH DIFFERENTIAL/PLATELET
Basophils Absolute: 0 10*3/uL (ref 0.0–0.1)
Basophils Relative: 0 % (ref 0–1)
Hemoglobin: 10.7 g/dL — ABNORMAL LOW (ref 12.0–15.0)
MCHC: 32.3 g/dL (ref 30.0–36.0)
Neutro Abs: 3.1 10*3/uL (ref 1.7–7.7)
Neutrophils Relative %: 50 % (ref 43–77)
Platelets: 309 10*3/uL (ref 150–400)
RDW: 17 % — ABNORMAL HIGH (ref 11.5–15.5)

## 2013-04-20 LAB — URINALYSIS, ROUTINE W REFLEX MICROSCOPIC
Ketones, ur: NEGATIVE mg/dL
Nitrite: NEGATIVE
Protein, ur: 100 mg/dL — AB
Urobilinogen, UA: 1 mg/dL (ref 0.0–1.0)
pH: 6 (ref 5.0–8.0)

## 2013-04-20 LAB — COMPREHENSIVE METABOLIC PANEL
AST: 16 U/L (ref 0–37)
Albumin: 3.2 g/dL — ABNORMAL LOW (ref 3.5–5.2)
Alkaline Phosphatase: 76 U/L (ref 39–117)
Chloride: 106 mEq/L (ref 96–112)
Potassium: 3.5 mEq/L (ref 3.5–5.1)
Sodium: 137 mEq/L (ref 135–145)
Total Bilirubin: <0.1 mg/dL — ABNORMAL LOW (ref 0.3–1.2)
Total Protein: 6.8 g/dL (ref 6.0–8.3)

## 2013-04-20 LAB — URINE MICROSCOPIC-ADD ON

## 2013-04-20 MED ORDER — MELOXICAM 15 MG PO TABS: 15.0000 mg | ORAL_TABLET | Freq: Every day | ORAL | Status: DC

## 2013-04-20 MED ORDER — OXYCODONE-ACETAMINOPHEN 5-325 MG PO TABS
2.0000 | ORAL_TABLET | Freq: Once | ORAL | Status: AC
  Administered 2013-04-20: 2 via ORAL
  Filled 2013-04-20: qty 2

## 2013-04-20 MED ORDER — KETOROLAC TROMETHAMINE 30 MG/ML IJ SOLN
30.0000 mg | Freq: Once | INTRAMUSCULAR | Status: AC
  Administered 2013-04-20: 30 mg via INTRAVENOUS
  Filled 2013-04-20: qty 1

## 2013-04-20 NOTE — ED Notes (Signed)
Patient is alert and oriented x3.  She is complaining of lower right abdominal pain that radiates to her back and down her left leg. She has had this issue before with no results from testing done in the ED.  Currently she rates her pain 8 of 10 with nausea.

## 2013-04-20 NOTE — ED Provider Notes (Signed)
Medical screening examination/treatment/procedure(s) were performed by non-physician practitioner and as supervising physician I was immediately available for consultation/collaboration.    Sunnie Nielsen, MD 04/20/13 [phone]

## 2013-04-20 NOTE — ED Provider Notes (Signed)
CSN: [id number]     Arrival date & time 04/20/13  6433 History   First MD Initiated Contact with Patient 04/20/13 [phone]     Chief Complaint  Patient presents with  . Abdominal Pain   HPI  History provided by the patient. The patient is a 22 year old female with past history of anemia who presents with complaints of returned and worsened lower pelvic pains. Patient reports having chronic issues greater than one year of recurrent sharp pelvic pains. Her symptoms have been associated with daily continuous vaginal bleeding and irregular menstrual cycles. Patient has had multiple evaluations for these complaints mostly in emergency departments without any firm diagnosis as to cause. Patient states she was seen also in the past by Dr. Alwyn Ren with OB/GYN and was started on Depo Shots and oral birth control pills to try and help with her symptoms of pain and bleeding. She states that this did not help with the symptoms and she has since stopped taking oral contraceptive pills but is continuing Depo Shots.  Her pain symptoms worsened over the past 3 days. They're similar to previous pains are described as sharp and severe. Nothing improved with ibuprofen or Tylenol. She denies any associated fever, chills or sweats. Does report slight nausea at times. No vomiting. No diarrhea or constipation. Denies any other vaginal discharge. No urinary changes.   Past Medical History  Diagnosis Date  . Anemia    Past Surgical History  Procedure Laterality Date  . Ankle surgery     Family History  Problem Relation Age of Onset  . Hypertension Other   . Diabetes Other   . Stroke Other    History  Substance Use Topics  . Smoking status: Never Smoker   . Smokeless tobacco: Not on file  . Alcohol Use: No   OB History   Grav Para Term Preterm Abortions TAB SAB Ect Mult Living                 Review of Systems  Constitutional: Negative for fever, chills, diaphoresis and fatigue.  Respiratory: Negative for  shortness of breath.   Cardiovascular: Negative for chest pain.  Gastrointestinal: Positive for nausea and abdominal pain. Negative for vomiting and constipation.  Genitourinary: Positive for vaginal bleeding, menstrual problem and pelvic pain. Negative for dysuria, frequency and flank pain.  Neurological: Negative for light-headedness.  All other systems reviewed and are negative.    Allergies  Review of patient's allergies indicates no known allergies.  Home Medications   Current Outpatient Rx  Name  Route  Sig  Dispense  Refill  . acetaminophen (TYLENOL) 500 MG tablet   Oral   Take 1,000 mg by mouth every 8 (eight) hours as needed for moderate pain.         Marland Kitchen ibuprofen (ADVIL,MOTRIN) 800 MG tablet   Oral   Take 800 mg by mouth every 8 (eight) hours as needed for moderate pain.         . medroxyPROGESTERone (DEPO-PROVERA) 150 MG/ML injection   Intramuscular   Inject 1 mL (150 mg total) into the muscle every 3 (three) months.   1 mL   0   . Prenat-Fe Poly-Methfol-FA-DHA (VITAFOL ULTRA) 29-0.6-0.4-200 MG CAPS   Oral   Take 1 capsule by mouth daily before breakfast.   30 capsule   11    BP 150/81  Pulse 68  Temp(Src) 98.7 F (37.1 C) (Oral)  Resp 18  SpO2 95% Physical Exam  Nursing note and vitals reviewed.  Constitutional: She is oriented to person, place, and time. She appears well-developed and well-nourished. No distress.  Obese  HENT:  Head: Normocephalic.  Cardiovascular: Normal rate and regular rhythm.   Pulmonary/Chest: Effort normal and breath sounds normal. No respiratory distress. She has no wheezes. She has no rales.  Abdominal: Soft. There is tenderness in the right lower quadrant, suprapubic area and left lower quadrant. There is no rebound, no guarding, no CVA tenderness, no tenderness at McBurney's point and negative Murphy's sign.  Genitourinary:  Refused  Neurological: She is alert and oriented to person, place, and time.  Skin: Skin is warm  and dry. No rash noted.  Psychiatric: She has a normal mood and affect. Her behavior is normal.    ED Course  Procedures   COORDINATION OF CARE: Nursing notes reviewed. Vital signs reviewed. Initial pt interview and examination performed.   3:15 AM patient seen and evaluated. Patient appears in moderate discomfort no acute distress. Symptoms are similar to previous chronic symptoms. She has had previous emergency room visits. Patient has not followed up with an OB/GYN specialist recently.  Discussed work up plan with pt at bedside, which includes lab testing, UA, pelvic exam and possible ultrasound study. Patient has refused a pelvic exam. I discussed the possible benefits, however patient does not wish to have this performed.    Treatment plan initiated: Medications  ketorolac (TORADOL) 30 MG/ML injection 30 mg (30 mg Intravenous Given 04/20/13 0338)  oxyCODONE-acetaminophen (PERCOCET/ROXICET) 5-325 MG per tablet 2 tablet (2 tablets Oral Given 04/20/13 0338)   6:00 AM patient feeling better after medications. No significant findings on lab testing or ultrasound study. No fibroids or ovarian cysts. No torsion. No signs for UTI. H&H stable. At this time patient will be discharged to followup with her OB/GYN specialist.     Results for orders placed during the hospital encounter of 04/20/13  URINALYSIS, ROUTINE W REFLEX MICROSCOPIC      Result Value Range   Color, Urine RED (*) YELLOW   APPearance CLOUDY (*) CLEAR   Specific Gravity, Urine 1.032 (*) 1.005 - 1.030   pH 6.0  5.0 - 8.0   Glucose, UA NEGATIVE  NEGATIVE mg/dL   Hgb urine dipstick LARGE (*) NEGATIVE   Bilirubin Urine NEGATIVE  NEGATIVE   Ketones, ur NEGATIVE  NEGATIVE mg/dL   Protein, ur 161 (*) NEGATIVE mg/dL   Urobilinogen, UA 1.0  0.0 - 1.0 mg/dL   Nitrite NEGATIVE  NEGATIVE   Leukocytes, UA SMALL (*) NEGATIVE  CBC WITH DIFFERENTIAL      Result Value Range   WBC 6.1  4.0 - 10.5 K/uL   RBC 4.89  3.87 - 5.11 MIL/uL    Hemoglobin 10.7 (*) 12.0 - 15.0 g/dL   HCT 09.6 (*) 04.5 - 40.9 %   MCV 67.7 (*) 78.0 - 100.0 fL   MCH 21.9 (*) 26.0 - 34.0 pg   MCHC 32.3  30.0 - 36.0 g/dL   RDW 81.1 (*) 91.4 - 78.2 %   Platelets 309  150 - 400 K/uL   Neutrophils Relative % 50  43 - 77 %   Neutro Abs 3.1  1.7 - 7.7 K/uL   Lymphocytes Relative 43  12 - 46 %   Lymphs Abs 2.6  0.7 - 4.0 K/uL   Monocytes Relative 5  3 - 12 %   Monocytes Absolute 0.3  0.1 - 1.0 K/uL   Eosinophils Relative 2  0 - 5 %   Eosinophils Absolute 0.1  0.0 - 0.7 K/uL   Basophils Relative 0  0 - 1 %   Basophils Absolute 0.0  0.0 - 0.1 K/uL  COMPREHENSIVE METABOLIC PANEL      Result Value Range   Sodium 137  135 - 145 mEq/L   Potassium 3.5  3.5 - 5.1 mEq/L   Chloride 106  96 - 112 mEq/L   CO2 23  19 - 32 mEq/L   Glucose, Bld 107 (*) 70 - 99 mg/dL   BUN 10  6 - 23 mg/dL   Creatinine, Ser 4.09  0.50 - 1.10 mg/dL   Calcium 8.9  8.4 - 81.1 mg/dL   Total Protein 6.8  6.0 - 8.3 g/dL   Albumin 3.2 (*) 3.5 - 5.2 g/dL   AST 16  0 - 37 U/L   ALT 20  0 - 35 U/L   Alkaline Phosphatase 76  39 - 117 U/L   Total Bilirubin <0.1 (*) 0.3 - 1.2 mg/dL   GFR calc non Af Amer >90  >90 mL/min   GFR calc Af Amer >90  >90 mL/min  URINE MICROSCOPIC-ADD ON      Result Value Range   WBC, UA FIELD OBSCURED BY RBC'S  <3 WBC/hpf   RBC / HPF TOO NUMEROUS TO COUNT  <3 RBC/hpf  POCT PREGNANCY, URINE      Result Value Range   Preg Test, Ur NEGATIVE  NEGATIVE      Imaging Review US Transvaginal Non-ob  04/20/2013   CLINICAL DATA:  Left lower quadrant abdominal pain and bleeding.  EXAM: TRANSABDOMINAL AND TRANSVAGINAL ULTRASOUND OF PELVIS  TECHNIQUE: Both transabdominal and transvaginal ultrasound examinations of the pelvis were performed. Transabdominal technique was performed for global imaging of the pelvis including uterus, ovaries, adnexal regions, and pelvic cul-de-sac. It was necessary to proceed with endovaginal exam following the transabdominal exam to  visualize the uterus and ovaries in greater detail.  COMPARISON:  None  FINDINGS: Uterus  Measurements: 8.5 x 5.3 x 5.4 cm. No fibroids or other mass visualized.  Endometrium  Thickness:  1.2 cm.  No focal abnormality visualized.  Right ovary  Measurements: 2.7 x 1.6 x 2.3 cm. Normal appearance/no adnexal mass.  Left ovary  Measurements: 2.7 x 1.9 x 2.4 cm. Normal appearance/no adnexal mass.  Other findings  Trace free fluid within the pelvic cul-de-sac is likely physiologic in nature.  IMPRESSION: Unremarkable pelvic ultrasound.   Electronically Signed   By: Roanna Raider M.D.   On: 04/20/2013 06:07   US Pelvis Complete  04/20/2013   CLINICAL DATA:  Left lower quadrant abdominal pain and bleeding.  EXAM: TRANSABDOMINAL AND TRANSVAGINAL ULTRASOUND OF PELVIS  TECHNIQUE: Both transabdominal and transvaginal ultrasound examinations of the pelvis were performed. Transabdominal technique was performed for global imaging of the pelvis including uterus, ovaries, adnexal regions, and pelvic cul-de-sac. It was necessary to proceed with endovaginal exam following the transabdominal exam to visualize the uterus and ovaries in greater detail.  COMPARISON:  None  FINDINGS: Uterus  Measurements: 8.5 x 5.3 x 5.4 cm. No fibroids or other mass visualized.  Endometrium  Thickness:  1.2 cm.  No focal abnormality visualized.  Right ovary  Measurements: 2.7 x 1.6 x 2.3 cm. Normal appearance/no adnexal mass.  Left ovary  Measurements: 2.7 x 1.9 x 2.4 cm. Normal appearance/no adnexal mass.  Other findings  Trace free fluid within the pelvic cul-de-sac is likely physiologic in nature.  IMPRESSION: Unremarkable pelvic ultrasound.   Electronically Signed   By: Leotis Shames  Chang M.D.   On: 04/20/2013 06:07      MDM   1. Pelvic pain   2. Dysfunctional uterine bleeding        Angus Seller, PA-C 04/20/13 [phone]

## 2013-05-26 ENCOUNTER — Encounter (HOSPITAL_COMMUNITY): Payer: Self-pay | Admitting: Emergency Medicine

## 2013-05-26 DIAGNOSIS — N949 Unspecified condition associated with female genital organs and menstrual cycle: Secondary | ICD-10-CM | POA: Insufficient documentation

## 2013-05-26 DIAGNOSIS — G8929 Other chronic pain: Secondary | ICD-10-CM | POA: Insufficient documentation

## 2013-05-26 DIAGNOSIS — R109 Unspecified abdominal pain: Secondary | ICD-10-CM | POA: Insufficient documentation

## 2013-05-26 DIAGNOSIS — R197 Diarrhea, unspecified: Secondary | ICD-10-CM | POA: Insufficient documentation

## 2013-05-26 DIAGNOSIS — Z79899 Other long term (current) drug therapy: Secondary | ICD-10-CM | POA: Insufficient documentation

## 2013-05-26 DIAGNOSIS — D649 Anemia, unspecified: Secondary | ICD-10-CM | POA: Insufficient documentation

## 2013-05-26 DIAGNOSIS — N938 Other specified abnormal uterine and vaginal bleeding: Secondary | ICD-10-CM | POA: Insufficient documentation

## 2013-05-26 DIAGNOSIS — Z3202 Encounter for pregnancy test, result negative: Secondary | ICD-10-CM | POA: Insufficient documentation

## 2013-05-26 LAB — POCT PREGNANCY, URINE: Preg Test, Ur: NEGATIVE

## 2013-05-26 MED ORDER — ONDANSETRON 4 MG PO TBDP: 8.0000 mg | ORAL_TABLET | Freq: Once | ORAL | Status: DC

## 2013-05-26 MED ORDER — ONDANSETRON HCL 4 MG/2ML IJ SOLN
4.0000 mg | Freq: Once | INTRAMUSCULAR | Status: DC
  Filled 2013-05-26: qty 2

## 2013-05-26 NOTE — ED Notes (Signed)
Vaginal bleeding, vomiting, and diarrhea started today.  C/o rt. Lower abd. Pain.

## 2013-05-27 ENCOUNTER — Emergency Department (HOSPITAL_COMMUNITY)
Admission: EM | Admit: 2013-05-27 | Discharge: 2013-05-27 | Disposition: A | Discharge status: Home / Self Care | Payer: Self-pay | Attending: Emergency Medicine | Admitting: Emergency Medicine

## 2013-05-27 ENCOUNTER — Emergency Department (HOSPITAL_COMMUNITY): Payer: Self-pay

## 2013-05-27 DIAGNOSIS — G8929 Other chronic pain: Secondary | ICD-10-CM

## 2013-05-27 DIAGNOSIS — N939 Abnormal uterine and vaginal bleeding, unspecified: Secondary | ICD-10-CM

## 2013-05-27 LAB — CBC WITH DIFFERENTIAL/PLATELET
Basophils Absolute: 0 10*3/uL (ref 0.0–0.1)
Basophils Relative: 0 % (ref 0–1)
HCT: 34.4 % — ABNORMAL LOW (ref 36.0–46.0)
Hemoglobin: 11.2 g/dL — ABNORMAL LOW (ref 12.0–15.0)
Lymphs Abs: 1.6 10*3/uL (ref 0.7–4.0)
MCH: 22.3 pg — ABNORMAL LOW (ref 26.0–34.0)
MCHC: 32.6 g/dL (ref 30.0–36.0)
Monocytes Relative: 7 % (ref 3–12)
Neutro Abs: 6.8 10*3/uL (ref 1.7–7.7)
Neutrophils Relative %: 75 % (ref 43–77)
RBC: 5.02 MIL/uL (ref 3.87–5.11)

## 2013-05-27 LAB — COMPREHENSIVE METABOLIC PANEL
ALT: 17 U/L (ref 0–35)
Albumin: 3.7 g/dL (ref 3.5–5.2)
Alkaline Phosphatase: 76 U/L (ref 39–117)
BUN: 8 mg/dL (ref 6–23)
Chloride: 105 mEq/L (ref 96–112)
Creatinine, Ser: 0.73 mg/dL (ref 0.50–1.10)
GFR calc Af Amer: >90 mL/min (ref >90)
GFR calc non Af Amer: >90 mL/min (ref >90)
Glucose, Bld: 88 mg/dL (ref 70–99)
Potassium: 3.8 mEq/L (ref 3.5–5.1)
Total Bilirubin: 0.2 mg/dL — ABNORMAL LOW (ref 0.3–1.2)
Total Protein: 7.5 g/dL (ref 6.0–8.3)

## 2013-05-27 LAB — LIPASE, BLOOD: Lipase: 22 U/L (ref 11–59)

## 2013-05-27 MED ORDER — IBUPROFEN 600 MG PO TABS: 600.0000 mg | ORAL_TABLET | Freq: Four times a day (QID) | ORAL | Status: DC | PRN

## 2013-05-27 MED ORDER — MORPHINE SULFATE 4 MG/ML IJ SOLN
4.0000 mg | Freq: Once | INTRAMUSCULAR | Status: AC
  Administered 2013-05-27: 4 mg via INTRAVENOUS
  Filled 2013-05-27: qty 1

## 2013-05-27 MED ORDER — ONDANSETRON 4 MG PO TBDP: ORAL_TABLET | ORAL | Status: DC

## 2013-05-27 MED ORDER — ONDANSETRON HCL 4 MG/2ML IJ SOLN
4.0000 mg | Freq: Once | INTRAMUSCULAR | Status: AC
  Administered 2013-05-27: 4 mg via INTRAVENOUS

## 2013-05-27 MED ORDER — SODIUM CHLORIDE 0.9 % IV BOLUS (SEPSIS)
1000.0000 mL | Freq: Once | INTRAVENOUS | Status: AC
  Administered 2013-05-27: 1000 mL via INTRAVENOUS

## 2013-05-27 MED ORDER — MORPHINE SULFATE 4 MG/ML IJ SOLN: 4.0000 mg | Freq: Once | INTRAMUSCULAR | Status: DC

## 2013-05-27 MED ORDER — TRAMADOL HCL 50 MG PO TABS: 50.0000 mg | ORAL_TABLET | Freq: Four times a day (QID) | ORAL | Status: DC | PRN

## 2013-05-27 NOTE — ED Notes (Signed)
Pt reports having just urinated and finding "small clots of blood" in her urine.

## 2013-05-27 NOTE — ED Notes (Signed)
Pt. Reports sudden onset N/V/D and vaginal bleeding approx 1 hour after eating dinner. Reports vaginal bleeding as dark red/brown with clots.

## 2013-05-27 NOTE — ED Provider Notes (Signed)
CSN: [id number]     Arrival date & time 05/26/13  2231 History   First MD Initiated Contact with Patient 05/27/13 0142     Chief Complaint  Patient presents with  . Nausea  . Emesis  . Diarrhea  . Vaginal Bleeding   (Consider location/radiation/quality/duration/timing/severity/associated sxs/prior Treatment) HPI Patient presents with lower abdominal pain, vomiting x4, diarrhea x2 and dark vaginal bleeding since last evening. She's had no fevers or chills. She states all the symptoms started after eating take-out food. She's on the Depo-Provera injection. Her last period was in November. She's had no urinary symptoms. Denies any shortness of breath or chest pain. She's had no further vomiting or diarrhea in emergency department. Past Medical History  Diagnosis Date  . Anemia    Past Surgical History  Procedure Laterality Date  . Ankle surgery     Family History  Problem Relation Age of Onset  . Hypertension Other   . Diabetes Other   . Stroke Other    History  Substance Use Topics  . Smoking status: Never Smoker   . Smokeless tobacco: Not on file  . Alcohol Use: No   OB History   Grav Para Term Preterm Abortions TAB SAB Ect Mult Living                 Review of Systems  Constitutional: Negative for fever and chills.  Respiratory: Negative for shortness of breath.   Gastrointestinal: Positive for nausea, vomiting, abdominal pain and diarrhea. Negative for constipation.  Genitourinary: Positive for vaginal bleeding. Negative for dysuria, flank pain, vaginal discharge, difficulty urinating and pelvic pain.  Musculoskeletal: Negative for back pain, neck pain and neck stiffness.  Skin: Negative for rash and wound.  Neurological: Negative for dizziness, weakness, light-headedness, numbness and headaches.  All other systems reviewed and are negative.    Allergies  Review of patient's allergies indicates no known allergies.  Home Medications   Current Outpatient Rx   Name  Route  Sig  Dispense  Refill  . Chlorphen-PE-Acetaminophen (TYLENOL ALLERGY MULTI-SYMPTOM PO)   Oral   Take 1-2 tablets by mouth every 6 (six) hours as needed (cough and cold symptoms).         . medroxyPROGESTERone (DEPO-PROVERA) 150 MG/ML injection   Intramuscular   Inject 1 mL (150 mg total) into the muscle every 3 (three) months.   1 mL   0    BP 130/65  Pulse 70  Temp(Src) 99.3 F (37.4 C) (Oral)  Resp 16  Ht 5\' 6"  (1.676 m)  Wt 339 lb (153.769 kg)  BMI 54.74 kg/m2  SpO2 99%  LMP 04/13/2013 Physical Exam  Nursing note and vitals reviewed. Constitutional: She is oriented to person, place, and time. She appears well-developed and well-nourished. No distress.  HENT:  Head: Normocephalic and atraumatic.  Mouth/Throat: Oropharynx is clear and moist.  Eyes: EOM are normal. Pupils are equal, round, and reactive to light.  Neck: Normal range of motion. Neck supple.  Cardiovascular: Normal rate and regular rhythm.   Pulmonary/Chest: Effort normal and breath sounds normal. No respiratory distress. She has no wheezes. She has no rales.  Abdominal: Soft. Bowel sounds are normal. She exhibits no distension and no mass. There is tenderness (mild diffuse lower abdominal tenderness to palpation. Mostly in the suprapubic region). There is no rebound and no guarding.  Genitourinary:  Patient refused pelvic exam.  Musculoskeletal: Normal range of motion. She exhibits no edema and no tenderness.  No CVA tenderness.  Neurological:  She is alert and oriented to person, place, and time.  Patient is alert and oriented x3 with clear, goal oriented speech. Patient has 5/5 motor in all extremities. Sensation is intact to light touch.   Skin: Skin is warm and dry. No rash noted. No erythema.  Psychiatric: She has a normal mood and affect. Her behavior is normal.    ED Course  Procedures (including critical care time) Labs Review Labs Reviewed  CBC WITH DIFFERENTIAL - Abnormal;  Notable for the following:    Hemoglobin 11.2 (*)    HCT 34.4 (*)    MCV 68.5 (*)    MCH 22.3 (*)    RDW 16.6 (*)    All other components within normal limits  COMPREHENSIVE METABOLIC PANEL - Abnormal; Notable for the following:    Total Bilirubin 0.2 (*)    All other components within normal limits  LIPASE, BLOOD  POCT PREGNANCY, URINE   Imaging Review US Transvaginal Non-ob  05/27/2013   CLINICAL DATA:  Pelvic pain and vaginal bleeding  EXAM: TRANSABDOMINAL AND TRANSVAGINAL ULTRASOUND OF PELVIS  DOPPLER ULTRASOUND OF OVARIES  TECHNIQUE: Both transabdominal and transvaginal ultrasound examinations of the pelvis were performed. Transabdominal technique was performed for global imaging of the pelvis including uterus, ovaries, adnexal regions, and pelvic cul-de-sac.  It was necessary to proceed with endovaginal exam following the transabdominal exam to visualize the ovaries and endometrium. Color and duplex Doppler ultrasound was utilized to evaluate blood flow to the ovaries.  COMPARISON:  Pelvic sonography 04/20/2013  FINDINGS: Uterus  Measurements: 9 x 5 x 5 cm. No fibroids or other mass visualized. Possible cesarean section scar.  Endometrium  Thickness: 9 mm. The endometrial myometrial junction is somewhat indistinct. No focal endometrial abnormality seen.  Right ovary  Measurements: 1.4 x 0.8 x 1 cm. Normal appearance/no adnexal mass.  Left ovary  Measurements: 2.5 x 1.6 x 2.2 cm. Normal appearance/no adnexal mass.  Pulsed Doppler evaluation of both ovaries demonstrates low resistance arterial and venous waveforms on the right. Vascular flow is seen on the left, with waveform morphology less well demonstrated. No torsion suspected based on morphologic features of the left ovary.  Other findings  Small, simple appearing free fluid.  IMPRESSION: Negative pelvic sonography.   Electronically Signed   By: Tiburcio Pea M.D.   On: 05/27/2013 03:18   US Pelvis Complete  05/27/2013   CLINICAL DATA:   Pelvic pain and vaginal bleeding  EXAM: TRANSABDOMINAL AND TRANSVAGINAL ULTRASOUND OF PELVIS  DOPPLER ULTRASOUND OF OVARIES  TECHNIQUE: Both transabdominal and transvaginal ultrasound examinations of the pelvis were performed. Transabdominal technique was performed for global imaging of the pelvis including uterus, ovaries, adnexal regions, and pelvic cul-de-sac.  It was necessary to proceed with endovaginal exam following the transabdominal exam to visualize the ovaries and endometrium. Color and duplex Doppler ultrasound was utilized to evaluate blood flow to the ovaries.  COMPARISON:  Pelvic sonography 04/20/2013  FINDINGS: Uterus  Measurements: 9 x 5 x 5 cm. No fibroids or other mass visualized. Possible cesarean section scar.  Endometrium  Thickness: 9 mm. The endometrial myometrial junction is somewhat indistinct. No focal endometrial abnormality seen.  Right ovary  Measurements: 1.4 x 0.8 x 1 cm. Normal appearance/no adnexal mass.  Left ovary  Measurements: 2.5 x 1.6 x 2.2 cm. Normal appearance/no adnexal mass.  Pulsed Doppler evaluation of both ovaries demonstrates low resistance arterial and venous waveforms on the right. Vascular flow is seen on the left, with waveform morphology less well  demonstrated. No torsion suspected based on morphologic features of the left ovary.  Other findings  Small, simple appearing free fluid.  IMPRESSION: Negative pelvic sonography.   Electronically Signed   By: Tiburcio Pea M.D.   On: 05/27/2013 03:18   [place] Art/ven Flow Abd Pelv Doppler  05/27/2013   CLINICAL DATA:  Pelvic pain and vaginal bleeding  EXAM: TRANSABDOMINAL AND TRANSVAGINAL ULTRASOUND OF PELVIS  DOPPLER ULTRASOUND OF OVARIES  TECHNIQUE: Both transabdominal and transvaginal ultrasound examinations of the pelvis were performed. Transabdominal technique was performed for global imaging of the pelvis including uterus, ovaries, adnexal regions, and pelvic cul-de-sac.  It was necessary to proceed with  endovaginal exam following the transabdominal exam to visualize the ovaries and endometrium. Color and duplex Doppler ultrasound was utilized to evaluate blood flow to the ovaries.  COMPARISON:  Pelvic sonography 04/20/2013  FINDINGS: Uterus  Measurements: 9 x 5 x 5 cm. No fibroids or other mass visualized. Possible cesarean section scar.  Endometrium  Thickness: 9 mm. The endometrial myometrial junction is somewhat indistinct. No focal endometrial abnormality seen.  Right ovary  Measurements: 1.4 x 0.8 x 1 cm. Normal appearance/no adnexal mass.  Left ovary  Measurements: 2.5 x 1.6 x 2.2 cm. Normal appearance/no adnexal mass.  Pulsed Doppler evaluation of both ovaries demonstrates low resistance arterial and venous waveforms on the right. Vascular flow is seen on the left, with waveform morphology less well demonstrated. No torsion suspected based on morphologic features of the left ovary.  Other findings  Small, simple appearing free fluid.  IMPRESSION: Negative pelvic sonography.   Electronically Signed   By: Tiburcio Pea M.D.   On: 05/27/2013 03:18    EKG Interpretation   None       MDM  Appears patient has chronic pelvic pain with irregular vaginal bleeding. She refused a pelvic exam during this visit. Ultrasound without any acute findings. Labs are stable. Patient states that her symptoms are improved. She's been advised to followup with OB/GYN. Return precautions have been given. Patient's voice understanding.   Loren Racer, MD 05/27/13 [phone]

## 2013-05-27 NOTE — ED Notes (Signed)
Discontinued patients IV in right [facility]

## 2013-06-12 ENCOUNTER — Emergency Department (HOSPITAL_COMMUNITY)
Admission: EM | Admit: 2013-06-12 | Discharge: 2013-06-12 | Disposition: A | Discharge status: Home / Self Care | Payer: Medicaid Other | Attending: Emergency Medicine | Admitting: Emergency Medicine

## 2013-06-12 ENCOUNTER — Encounter (HOSPITAL_COMMUNITY): Payer: Self-pay | Admitting: Emergency Medicine

## 2013-06-12 ENCOUNTER — Emergency Department (HOSPITAL_COMMUNITY): Payer: Medicaid Other

## 2013-06-12 DIAGNOSIS — Z8739 Personal history of other diseases of the musculoskeletal system and connective tissue: Secondary | ICD-10-CM | POA: Insufficient documentation

## 2013-06-12 DIAGNOSIS — M25579 Pain in unspecified ankle and joints of unspecified foot: Secondary | ICD-10-CM | POA: Insufficient documentation

## 2013-06-12 DIAGNOSIS — M25572 Pain in left ankle and joints of left foot: Secondary | ICD-10-CM

## 2013-06-12 DIAGNOSIS — M7989 Other specified soft tissue disorders: Secondary | ICD-10-CM | POA: Insufficient documentation

## 2013-06-12 DIAGNOSIS — Z888 Allergy status to other drugs, medicaments and biological substances status: Secondary | ICD-10-CM | POA: Insufficient documentation

## 2013-06-12 DIAGNOSIS — D649 Anemia, unspecified: Secondary | ICD-10-CM | POA: Insufficient documentation

## 2013-06-12 MED ORDER — IBUPROFEN 800 MG PO TABS: 800.0000 mg | ORAL_TABLET | Freq: Three times a day (TID) | ORAL | Status: DC

## 2013-06-12 MED ORDER — IBUPROFEN 200 MG PO TABS
600.0000 mg | ORAL_TABLET | Freq: Once | ORAL | Status: AC
  Administered 2013-06-12: 600 mg via ORAL
  Filled 2013-06-12: qty 3

## 2013-06-12 MED ORDER — OXYCODONE-ACETAMINOPHEN 5-325 MG PO TABS
1.0000 | ORAL_TABLET | Freq: Once | ORAL | Status: AC
  Administered 2013-06-12: 1 via ORAL
  Filled 2013-06-12: qty 1

## 2013-06-12 NOTE — ED Notes (Signed)
Pt reports left ankle pain for 1 week, hx of injury with surgery 7 years ago to same, pt denies injury today

## 2013-06-12 NOTE — ED Provider Notes (Signed)
CSN: [id number]     Arrival date & time 06/12/13  [id number] History   First MD Initiated Contact with Patient 06/12/13 [phone]     Chief Complaint  Patient presents with  . Ankle Pain   HPI This is ongoing left ankle pain over the past several months has worsened over the last week.  She had prior surgical repair of a complex left ankle fracture 7 years ago.  She states she occasionally has pain like this but is worsened recently.  [place] [facility] [place]  Her pain is worsened by walking and palpation.  She reports swelling of the lateral portion of her ankle.  She states no improvement with ibuprofen or Tylenol   Past Medical History  Diagnosis Date  . Anemia    Past Surgical History  Procedure Laterality Date  . Ankle surgery     Family History  Problem Relation Age of Onset  . Hypertension Other   . Diabetes Other   . Stroke Other    History  Substance Use Topics  . Smoking status: Never Smoker   . Smokeless tobacco: Not on file  . Alcohol Use: No   OB History   Grav Para Term Preterm Abortions TAB SAB Ect Mult Living                 Review of Systems  All other systems reviewed and are negative.    Allergies  Tramadol  Home Medications   Current Outpatient Rx  Name  Route  Sig  Dispense  Refill  . ibuprofen (ADVIL,MOTRIN) 600 MG tablet   Oral   Take 1 tablet (600 mg total) by mouth every 6 (six) hours as needed.   30 tablet   0   . ibuprofen (ADVIL,MOTRIN) 800 MG tablet   Oral   Take 1 tablet (800 mg total) by mouth 3 (three) times daily.   15 tablet   0    BP 133/77  Pulse 87  Temp(Src) 98.7 F (37.1 C) (Oral)  Ht 5\' 5"  (1.651 m)  Wt 318 lb (144.244 kg)  BMI 52.92 kg/m2  SpO2 100%  LMP 06/12/2013 Physical Exam  Nursing note and vitals reviewed. Constitutional: She is oriented to person, place, and time. She appears well-developed and well-nourished.  HENT:  Head: Normocephalic.  Eyes: EOM are normal.  Neck:  Normal range of motion.  Pulmonary/Chest: Effort normal.  Abdominal: She exhibits no distension.  Musculoskeletal: Normal range of motion.  Mild tenderness over left lateral malleolus.  Mild swelling of the left lateral malleolus.  Normal PT and DP pulses the left foot.  Compartments soft.  Full range of motion of left ankle.  No erythema or warmth of the left foot or ankle  Neurological: She is alert and oriented to person, place, and time.  Psychiatric: She has a normal mood and affect.    ED Course  Procedures (including critical care time) Labs Review Labs Reviewed - No data to display Imaging Review Dg Ankle Complete Left  06/12/2013   CLINICAL DATA:  Left lateral malleolar discomfort in the left ankle. Slippery work area.  EXAM: LEFT ANKLE COMPLETE - 3+ VIEW  COMPARISON:  None.  FINDINGS: Postoperative changes with plate and screw fixation of an old fracture of the distal left fibula. Hardware components appear intact. 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 No evidence of acute fracture or dislocation.  IMPRESSION: Old fracture deformity of the distal left fibula with plate and  screw fixation. No acute changes demonstrated.   Electronically Signed   By: Burman Nieves M.D.   On: 06/12/2013 05:47  I personally reviewed the imaging tests through PACS system I reviewed available ER/hospitalization records through the EMR   EKG Interpretation   None       MDM   1. Left ankle pain    Plain films without pathology.  Home with anti-inflammatories, ice, elevation.  Orthopedic surgery followup    Lyanne Co, MD 06/12/13 [phone]

## 2013-09-13 DIAGNOSIS — R112 Nausea with vomiting, unspecified: Secondary | ICD-10-CM | POA: Insufficient documentation

## 2013-09-13 DIAGNOSIS — Z79899 Other long term (current) drug therapy: Secondary | ICD-10-CM | POA: Insufficient documentation

## 2013-09-13 DIAGNOSIS — D649 Anemia, unspecified: Secondary | ICD-10-CM | POA: Insufficient documentation

## 2013-09-13 DIAGNOSIS — Z3202 Encounter for pregnancy test, result negative: Secondary | ICD-10-CM | POA: Insufficient documentation

## 2013-09-13 DIAGNOSIS — R1084 Generalized abdominal pain: Secondary | ICD-10-CM | POA: Insufficient documentation

## 2013-09-13 DIAGNOSIS — E669 Obesity, unspecified: Secondary | ICD-10-CM | POA: Insufficient documentation

## 2013-09-14 ENCOUNTER — Encounter (HOSPITAL_COMMUNITY): Payer: Self-pay | Admitting: Emergency Medicine

## 2013-09-14 ENCOUNTER — Emergency Department (HOSPITAL_COMMUNITY): Payer: Medicaid Other

## 2013-09-14 ENCOUNTER — Emergency Department (HOSPITAL_COMMUNITY)
Admission: EM | Admit: 2013-09-14 | Discharge: 2013-09-14 | Disposition: A | Discharge status: Home / Self Care | Payer: Medicaid Other | Attending: Emergency Medicine | Admitting: Emergency Medicine

## 2013-09-14 DIAGNOSIS — R1110 Vomiting, unspecified: Secondary | ICD-10-CM

## 2013-09-14 DIAGNOSIS — R109 Unspecified abdominal pain: Secondary | ICD-10-CM

## 2013-09-14 LAB — CBC WITH DIFFERENTIAL/PLATELET
BASOS ABS: 0 10*3/uL (ref 0.0–0.1)
Basophils Relative: 0 % (ref 0–1)
EOS PCT: 2 % (ref 0–5)
Eosinophils Absolute: 0.1 10*3/uL (ref 0.0–0.7)
HEMATOCRIT: 34.3 % — AB (ref 36.0–46.0)
Hemoglobin: 11.6 g/dL — ABNORMAL LOW (ref 12.0–15.0)
LYMPHS ABS: 2.6 10*3/uL (ref 0.7–4.0)
LYMPHS PCT: 38 % (ref 12–46)
MCH: 23.2 pg — ABNORMAL LOW (ref 26.0–34.0)
MCHC: 33.8 g/dL (ref 30.0–36.0)
MCV: 68.6 fL — ABNORMAL LOW (ref 78.0–100.0)
MONOS PCT: 4 % (ref 3–12)
Monocytes Absolute: 0.3 10*3/uL (ref 0.1–1.0)
NEUTROS ABS: 3.8 10*3/uL (ref 1.7–7.7)
Neutrophils Relative %: 56 % (ref 43–77)
Platelets: 291 10*3/uL (ref 150–400)
RBC: 5 MIL/uL (ref 3.87–5.11)
RDW: 17.3 % — AB (ref 11.5–15.5)
WBC: 6.8 10*3/uL (ref 4.0–10.5)

## 2013-09-14 LAB — URINALYSIS, ROUTINE W REFLEX MICROSCOPIC
BILIRUBIN URINE: NEGATIVE
GLUCOSE, UA: NEGATIVE mg/dL
Ketones, ur: 15 mg/dL — AB
Leukocytes, UA: NEGATIVE
Nitrite: NEGATIVE
Protein, ur: NEGATIVE mg/dL
SPECIFIC GRAVITY, URINE: 1.027 (ref 1.005–1.030)
UROBILINOGEN UA: 0.2 mg/dL (ref 0.0–1.0)
pH: 7 (ref 5.0–8.0)

## 2013-09-14 LAB — COMPREHENSIVE METABOLIC PANEL
ALBUMIN: 3.4 g/dL — AB (ref 3.5–5.2)
ALK PHOS: 60 U/L (ref 39–117)
ALT: 11 U/L (ref 0–35)
AST: 13 U/L (ref 0–37)
BUN: 7 mg/dL (ref 6–23)
CHLORIDE: 101 meq/L (ref 96–112)
CO2: 25 mEq/L (ref 19–32)
Calcium: 8.7 mg/dL (ref 8.4–10.5)
Creatinine, Ser: 0.62 mg/dL (ref 0.50–1.10)
GFR calc Af Amer: >90 mL/min (ref >90)
GFR calc non Af Amer: >90 mL/min (ref >90)
GLUCOSE: 110 mg/dL — AB (ref 70–99)
Potassium: 3.3 mEq/L — ABNORMAL LOW (ref 3.7–5.3)
Sodium: 139 mEq/L (ref 137–147)
Total Protein: 7 g/dL (ref 6.0–8.3)

## 2013-09-14 LAB — PREGNANCY, URINE: Preg Test, Ur: NEGATIVE

## 2013-09-14 LAB — URINE MICROSCOPIC-ADD ON

## 2013-09-14 LAB — LIPASE, BLOOD: Lipase: 21 U/L (ref 11–59)

## 2013-09-14 MED ORDER — ONDANSETRON 4 MG PO TBDP
8.0000 mg | ORAL_TABLET | Freq: Once | ORAL | Status: AC
  Administered 2013-09-14: 8 mg via ORAL
  Filled 2013-09-14: qty 2

## 2013-09-14 MED ORDER — FAMOTIDINE 20 MG PO TABS: 20.0000 mg | ORAL_TABLET | Freq: Two times a day (BID) | ORAL | Status: DC

## 2013-09-14 MED ORDER — IOHEXOL 300 MG/ML  SOLN
25.0000 mL | INTRAMUSCULAR | Status: AC
  Administered 2013-09-14: 25 mL via ORAL

## 2013-09-14 MED ORDER — FENTANYL CITRATE 0.05 MG/ML IJ SOLN
50.0000 ug | INTRAMUSCULAR | Status: DC | PRN
  Administered 2013-09-14: 50 ug via INTRAVENOUS
  Filled 2013-09-14: qty 2

## 2013-09-14 MED ORDER — POTASSIUM CHLORIDE CRYS ER 20 MEQ PO TBCR
40.0000 meq | EXTENDED_RELEASE_TABLET | Freq: Once | ORAL | Status: AC
  Administered 2013-09-14: 40 meq via ORAL
  Filled 2013-09-14: qty 2

## 2013-09-14 MED ORDER — ONDANSETRON HCL 4 MG/2ML IJ SOLN
4.0000 mg | Freq: Once | INTRAMUSCULAR | Status: AC
  Administered 2013-09-14: 4 mg via INTRAVENOUS
  Filled 2013-09-14: qty 2

## 2013-09-14 MED ORDER — IOHEXOL 300 MG/ML  SOLN
80.0000 mL | Freq: Once | INTRAMUSCULAR | Status: AC | PRN
  Administered 2013-09-14: 80 mL via INTRAVENOUS

## 2013-09-14 MED ORDER — ONDANSETRON HCL 4 MG PO TABS: 4.0000 mg | ORAL_TABLET | Freq: Four times a day (QID) | ORAL | Status: DC

## 2013-09-14 NOTE — Discharge Instructions (Signed)
Abdominal Pain, Adult °Many things can cause abdominal pain. Usually, abdominal pain is not caused by a disease and will improve without treatment. It can often be observed and treated at home. Your health care provider will do a physical exam and possibly order blood tests and X-rays to help determine the seriousness of your pain. However, in many cases, more time must pass before a clear cause of the pain can be found. Before that point, your health care provider may not know if you need more testing or further treatment. °HOME CARE INSTRUCTIONS  °Monitor your abdominal pain for any changes. The following actions may help to alleviate any discomfort you are experiencing: °· Only take over-the-counter or prescription medicines as directed by your health care provider. °· Do not take laxatives unless directed to do so by your health care provider. °· Try a clear liquid diet (broth, tea, or water) as directed by your health care provider. Slowly move to a bland diet as tolerated. °SEEK MEDICAL CARE IF: °· You have unexplained abdominal pain. °· You have abdominal pain associated with nausea or diarrhea. °· You have pain when you urinate or have a bowel movement. °· You experience abdominal pain that wakes you in the night. °· You have abdominal pain that is worsened or improved by eating food. °· You have abdominal pain that is worsened with eating fatty foods. °SEEK IMMEDIATE MEDICAL CARE IF:  °· Your pain does not go away within 2 hours. °· You have a fever. °· You keep throwing up (vomiting). °· Your pain is felt only in portions of the abdomen, such as the right side or the left lower portion of the abdomen. °· You pass bloody or black tarry stools. °MAKE SURE YOU: °· Understand these instructions.   °· Will watch your condition.   °· Will get help right away if you are not doing well or get worse.   °Document Released: 03/07/2005 Document Revised: 03/18/2013 Document Reviewed: 02/04/2013 °[employer]® Patient  Information ©2014 [employer]. ° °

## 2013-09-14 NOTE — ED Notes (Signed)
Returned from CT.  Reports "pain is beginning to return."

## 2013-09-14 NOTE — ED Notes (Signed)
The pt is c/o lower abd and back pain for 5 days.  nv and diarrhea.  Unable to hold anything down.  lmp now

## 2013-09-14 NOTE — ED Notes (Signed)
MD at bedside. Asked to provide Work Note

## 2013-09-14 NOTE — ED Provider Notes (Signed)
CSN: [id number]     Arrival date & time 09/13/13  2354 History   First MD Initiated Contact with Patient 09/14/13 [phone]     Chief Complaint  Patient presents with  . Abdominal Pain     (Consider location/radiation/quality/duration/timing/severity/associated sxs/prior Treatment) HPI History provided by patient. Abdominal pain worsening over the last 5 days now associated nausea vomiting. Unable to leaving them. Last bowel movement yesterday without diarrhea. No blood in stools. No blood in emesis. Pain is sharp in quality located all over. No trauma. No rash. No recent travel. No known sick contacts. No history of same. Pain radiates to her back. No dysuria. No hematuria. Past Medical History  Diagnosis Date  . Anemia    Past Surgical History  Procedure Laterality Date  . Ankle surgery     Family History  Problem Relation Age of Onset  . Hypertension Other   . Diabetes Other   . Stroke Other    History  Substance Use Topics  . Smoking status: Never Smoker   . Smokeless tobacco: Not on file  . Alcohol Use: No   OB History   Grav Para Term Preterm Abortions TAB SAB Ect Mult Living                 Review of Systems  Constitutional: Negative for fever and chills.  Respiratory: Negative for shortness of breath.   Cardiovascular: Negative for chest pain.  Gastrointestinal: Positive for nausea, vomiting and abdominal pain.  Genitourinary: Negative for dysuria.  Musculoskeletal: Negative for neck pain and neck stiffness.  Skin: Negative for rash.  Neurological: Negative for headaches.  All other systems reviewed and are negative.      Allergies  Tramadol  Home Medications   Current Outpatient Rx  Name  Route  Sig  Dispense  Refill  . ferrous sulfate 325 (65 FE) MG tablet   Oral   Take 325 mg by mouth daily.         Marland Kitchen. ibuprofen (ADVIL,MOTRIN) 800 MG tablet   Oral   Take 400 mg by mouth every 8 (eight) hours as needed for mild pain.          BP 120/62   Pulse 74  Temp(Src) 98.3 F (36.8 C)  Resp 22  Ht 5\' 5"  (1.651 m)  Wt 314 lb (142.429 kg)  BMI 52.25 kg/m2  SpO2 98%  LMP 09/14/2013 Physical Exam  Constitutional: She is oriented to person, place, and time. She appears well-developed and well-nourished.  HENT:  Head: Normocephalic and atraumatic.  Mouth/Throat: Oropharynx is clear and moist.  Eyes: EOM are normal. Pupils are equal, round, and reactive to light. No scleral icterus.  Neck: Neck supple.  Cardiovascular: Normal rate, regular rhythm and intact distal pulses.   Pulmonary/Chest: Effort normal and breath sounds normal. No respiratory distress.  Abdominal:  Obese. Diffusely tender with even light palpation  Musculoskeletal: Normal range of motion. She exhibits no edema.  Neurological: She is alert and oriented to person, place, and time.  Skin: Skin is warm and dry.    ED Course  Procedures (including critical care time) Labs Review Labs Reviewed  CBC WITH DIFFERENTIAL - Abnormal; Notable for the following:    Hemoglobin 11.6 (*)    HCT 34.3 (*)    MCV 68.6 (*)    MCH 23.2 (*)    RDW 17.3 (*)    All other components within normal limits  COMPREHENSIVE METABOLIC PANEL - Abnormal; Notable for the following:  Potassium 3.3 (*)    Glucose, Bld 110 (*)    Albumin 3.4 (*)    Total Bilirubin <0.2 (*)    All other components within normal limits  URINALYSIS, ROUTINE W REFLEX MICROSCOPIC - Abnormal; Notable for the following:    APPearance CLOUDY (*)    Hgb urine dipstick MODERATE (*)    Ketones, ur 15 (*)    All other components within normal limits  URINE MICROSCOPIC-ADD ON - Abnormal; Notable for the following:    Squamous Epithelial / LPF FEW (*)    Bacteria, UA FEW (*)    All other components within normal limits  LIPASE, BLOOD  PREGNANCY, URINE   Imaging Review Ct Abdomen Pelvis W Contrast  09/14/2013   CLINICAL DATA:  Left lower quadrant and left pelvic pain, nausea vomiting.  EXAM: CT ABDOMEN AND  PELVIS WITH CONTRAST  TECHNIQUE: Multidetector CT imaging of the abdomen and pelvis was performed using the standard protocol following bolus administration of intravenous contrast.  CONTRAST:  80mL OMNIPAQUE IOHEXOL 300 MG/ML  SOLN  COMPARISON:  None.  FINDINGS: The visualized lung bases are clear.  The liver demonstrates a normal contrast enhanced appearance. The gallbladder is within normal limits. No biliary ductal dilatation. The spleen, adrenal glands, and pancreas demonstrate a normal contrast enhanced appearance.  The kidneys are equal in size with symmetric enhancement. No nephrolithiasis, hydronephrosis, or focal enhancing renal mass.  No evidence of bowel obstruction. Stomach within normal limits. No abnormal wall thickening, mucosal enhancement, or inflammatory fat stranding seen about the bowels. Appendix is well visualized in the lower mid pelvis and is normal in caliber without associated inflammatory changes to suggest acute appendicitis.  Bladder is normal. 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 Ovaries are within normal limits. No adnexal masses.  No free air or fluid. No enlarged intra-abdominal or pelvic lymph nodes. Normal intravascular enhancement seen throughout the abdomen and pelvis.  No acute osseous abnormality. No worrisome lytic or blastic osseous lesions.  IMPRESSION: 1. No CT evidence of acute intra-abdominal or pelvic process identified. Normal appendix. 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   Electronically Signed   By: Rise Mu M.D.   On: 09/14/2013 06:56    IV fentanyl/ zofran provided  7:34 AM pain and symptoms improved, tolerating POs.   Plan d/c home, Rx pain Pepcid and zofran as needed with outpatient follow up/ referral provided.   Return precautions verbalized as understood.   MDM   Dx: Abd pain. Vomiting  Improved with zofran and IV narcotics CT scan and labs obtained/ reviewed as above VS and nurses notes reviewed    Sunnie Nielsen,  MD 09/14/13 [phone]

## 2013-09-14 NOTE — ED Notes (Signed)
Patient transported to CT 

## 2013-12-13 ENCOUNTER — Encounter (HOSPITAL_COMMUNITY): Payer: Self-pay | Admitting: Emergency Medicine

## 2013-12-13 ENCOUNTER — Emergency Department (HOSPITAL_COMMUNITY)
Admission: EM | Admit: 2013-12-13 | Discharge: 2013-12-14 | Disposition: A | Discharge status: Home / Self Care | Payer: Medicaid Other | Attending: Emergency Medicine | Admitting: Emergency Medicine

## 2013-12-13 DIAGNOSIS — M25572 Pain in left ankle and joints of left foot: Secondary | ICD-10-CM

## 2013-12-13 DIAGNOSIS — Z9889 Other specified postprocedural states: Secondary | ICD-10-CM | POA: Diagnosis not present

## 2013-12-13 DIAGNOSIS — D649 Anemia, unspecified: Secondary | ICD-10-CM | POA: Diagnosis not present

## 2013-12-13 DIAGNOSIS — M25579 Pain in unspecified ankle and joints of unspecified foot: Secondary | ICD-10-CM | POA: Diagnosis not present

## 2013-12-13 NOTE — ED Notes (Signed)
The pt is c/o lt ankle since January.  Her pain has been worse for the past 2 weeks.  lmp  2 days ago

## 2013-12-14 ENCOUNTER — Emergency Department (HOSPITAL_COMMUNITY): Payer: Medicaid Other

## 2013-12-14 MED ORDER — IBUPROFEN 800 MG PO TABS: 800.0000 mg | ORAL_TABLET | Freq: Three times a day (TID) | ORAL | Status: DC

## 2013-12-14 NOTE — ED Notes (Signed)
Ortho at bedside for ASO

## 2013-12-14 NOTE — ED Notes (Signed)
Transported to X-ray

## 2013-12-14 NOTE — ED Notes (Signed)
Returned from xray

## 2013-12-14 NOTE — Discharge Instructions (Signed)
Take Ibuprofen for pain  Use RICE method - see below Return to the emergency department if you develop any changing/worsening condition or any other concerns (please read additional information regarding your condition below)   Ankle Pain Ankle pain is a common symptom. The bones, cartilage, tendons, and muscles of the ankle joint perform a lot of work each day. The ankle joint holds your body weight and allows you to move around. Ankle pain can occur on either side or back of 1 or both ankles. Ankle pain may be sharp and burning or dull and aching. There may be tenderness, stiffness, redness, or warmth around the ankle. The pain occurs more often when a person walks or puts pressure on the ankle. CAUSES  There are many reasons ankle pain can develop. It is important to work with your caregiver to identify the cause since many conditions can impact the bones, cartilage, muscles, and tendons. Causes for ankle pain include:  Injury, including a break (fracture), sprain, or strain often due to a fall, sports, or a high-impact activity.  Swelling (inflammation) of a tendon (tendonitis).  Achilles tendon rupture.  Ankle instability after repeated sprains and strains.  Poor foot alignment.  Pressure on a nerve (tarsal tunnel syndrome).  Arthritis in the ankle or the lining of the ankle.  Crystal formation in the ankle (gout or pseudogout). DIAGNOSIS  A diagnosis is based on your medical history, your symptoms, results of your physical exam, and results of diagnostic tests. Diagnostic tests may include X-ray exams or a computerized magnetic scan (magnetic resonance imaging, MRI). TREATMENT  Treatment will depend on the cause of your ankle pain and may include:  Keeping pressure off the ankle and limiting activities.  Using crutches or other walking support (a cane or brace).  Using rest, ice, compression, and elevation.  Participating in physical therapy or home exercises.  Wearing  shoe inserts or special shoes.  Losing weight.  Taking medications to reduce pain or swelling or receiving an injection.  Undergoing surgery. HOME CARE INSTRUCTIONS   Only take over-the-counter or prescription medicines for pain, discomfort, or fever as directed by your caregiver.  Put ice on the injured area.  Put ice in a plastic bag.  Place a towel between your skin and the bag.  Leave the ice on for 15-20 minutes at a time, 03-04 times a day.  Keep your leg raised (elevated) when possible to lessen swelling.  Avoid activities that cause ankle pain.  Follow specific exercises as directed by your caregiver.  Record how often you have ankle pain, the location of the pain, and what it feels like. This information may be helpful to you and your caregiver.  Ask your caregiver about returning to work or sports and whether you should drive.  Follow up with your caregiver for further examination, therapy, or testing as directed. SEEK MEDICAL CARE IF:   Pain or swelling continues or worsens beyond 1 week.  You have an oral temperature above 102 F (38.9 C).  You are feeling unwell or have chills.  You are having an increasingly difficult time with walking.  You have loss of sensation or other new symptoms.  You have questions or concerns. MAKE SURE YOU:   Understand these instructions.  Will watch your condition.  Will get help right away if you are not doing well or get worse. Document Released: 11/15/2009 Document Revised: 08/20/2011 Document Reviewed: 11/15/2009 [facility] Patient Information 2015 [employer], [place]. This information is not intended to replace  advice given to you by your health care provider. Make sure you discuss any questions you have with your health care provider.  RICE: Routine Care for Injuries The routine care of many injuries includes Rest, Ice, Compression, and Elevation (RICE). HOME CARE INSTRUCTIONS  Rest is needed to allow your body to  heal. Routine activities can usually be resumed when comfortable. Injured tendons and bones can take up to 6 weeks to heal. Tendons are the cord-like structures that attach muscle to bone.  Ice following an injury helps keep the swelling down and reduces pain.  Put ice in a plastic bag.  Place a towel between your skin and the bag.  Leave the ice on for 15-20 minutes, 3-4 times a day, or as directed by your health care provider. Do this while awake, for the first 24 to 48 hours. After that, continue as directed by your caregiver.  Compression helps keep swelling down. It also gives support and helps with discomfort. If an elastic bandage has been applied, it should be removed and reapplied every 3 to 4 hours. It should not be applied tightly, but firmly enough to keep swelling down. Watch fingers or toes for swelling, bluish discoloration, coldness, numbness, or excessive pain. If any of these problems occur, remove the bandage and reapply loosely. Contact your caregiver if these problems continue.  Elevation helps reduce swelling and decreases pain. With extremities, such as the arms, hands, legs, and feet, the injured area should be placed near or above the level of the heart, if possible. SEEK IMMEDIATE MEDICAL CARE IF:  You have persistent pain and swelling.  You develop redness, numbness, or unexpected weakness.  Your symptoms are getting worse rather than improving after several days. These symptoms may indicate that further evaluation or further X-rays are needed. Sometimes, X-rays may not show a small broken bone (fracture) until 1 week or 10 days later. Make a follow-up appointment with your caregiver. Ask when your X-ray results will be ready. Make sure you get your X-ray results. Document Released: 09/09/2000 Document Revised: 06/02/2013 Document Reviewed: 10/27/2010 [facility] Patient Information 2015 [employer], [place]. This information is not intended to replace advice given to you by  your health care provider. Make sure you discuss any questions you have with your health care provider.

## 2013-12-15 NOTE — ED Provider Notes (Signed)
CSN: [id number]     Arrival date & time 12/13/13  2248 History   None    Chief Complaint  Patient presents with  . Ankle Pain   HPI  Meghan Edwards is a 23 y.o. female with a PMH of anemia who presents to the ED for evaluation of left ankle pain. History was provided by the patient. Patient states she has had left ankle pain since January. Her pain has been worse the past two weeks since starting a new job. Also, she she twisted it two days ago. Patient has hx of ankle surgery in the past. Pain is a constant aching pain which is worse with movement and ambulation. No numbness, tingling, weakness, loss of sensation, fever, joint swelling/redness, or other concerns. Patient states she has a follow-up appointment on 12/31/13 with her surgeon.    Past Medical History  Diagnosis Date  . Anemia    Past Surgical History  Procedure Laterality Date  . Ankle surgery     Family History  Problem Relation Age of Onset  . Hypertension Other   . Diabetes Other   . Stroke Other    History  Substance Use Topics  . Smoking status: Never Smoker   . Smokeless tobacco: Not on file  . Alcohol Use: No   OB History   Grav Para Term Preterm Abortions TAB SAB Ect Mult Living                  Review of Systems  Constitutional: Negative for fever, chills, activity change, appetite change and fatigue.  Cardiovascular: Negative for leg swelling.  Musculoskeletal: Positive for arthralgias. Negative for gait problem, joint swelling and myalgias.  Skin: Negative for color change and wound.  Neurological: Negative for weakness and numbness.    Allergies  Tramadol  Home Medications   Prior to Admission medications   Medication Sig Start Date End Date Taking? Authorizing Provider  famotidine (PEPCID) 20 MG tablet Take 1 tablet (20 mg total) by mouth 2 (two) times daily. 09/14/13   Sunnie NielsenBrian Opitz, MD  ferrous sulfate 325 (65 FE) MG tablet Take 325 mg by mouth daily.    Historical Provider, MD  ibuprofen  (ADVIL,MOTRIN) 800 MG tablet Take 400 mg by mouth every 8 (eight) hours as needed for mild pain. 1/2 tablet    Historical Provider, MD  ibuprofen (ADVIL,MOTRIN) 800 MG tablet Take 1 tablet (800 mg total) by mouth 3 (three) times daily. 12/14/13   Jillyn LedgerJessica K Tammie Yanda, PA-C  medroxyPROGESTERone (DEPO-PROVERA) 150 MG/ML injection Inject 150 mg into the muscle every 3 (three) months. 06/24/13   Historical Provider, MD  ondansetron (ZOFRAN) 4 MG tablet Take 1 tablet (4 mg total) by mouth every 6 (six) hours. 09/14/13   Sunnie NielsenBrian Opitz, MD   BP 136/78  Pulse 84  Temp(Src) 98.4 F (36.9 C)  Resp 18  Ht 5\' 4"  (1.626 m)  Wt 317 lb (143.79 kg)  BMI 54.39 kg/m2  SpO2 97%  LMP 12/12/2013  Filed Vitals:   12/13/13 2255  BP: 136/78  Pulse: 84  Temp: 98.4 F (36.9 C)  Resp: 18  Height: 5\' 4"  (1.626 m)  Weight: 317 lb (143.79 kg)  SpO2: 97%    Physical Exam  Nursing note and vitals reviewed. Constitutional: She is oriented to person, place, and time. She appears well-developed and well-nourished. No distress.  HENT:  Head: Normocephalic and atraumatic.  Right Ear: External ear normal.  Left Ear: External ear normal.  Nose: Nose normal.  Mouth/Throat:  Oropharynx is clear and moist.  Eyes: Conjunctivae are normal. Right eye exhibits no discharge. Left eye exhibits no discharge.  Neck: Neck supple.  Cardiovascular: Normal rate.   Dorsalis pedis pulses present and equal bilaterally  Pulmonary/Chest: Effort normal.  Abdominal: Soft.  Musculoskeletal: Normal range of motion. She exhibits tenderness. She exhibits no edema.       Feet:  Tenderness to palpation to the left lateral malleolus. No tenderness to palpation to the calf, foot, or knee on the left. ROM intact in the left ankle. Patient able to ambulate without difficulty or ataxia  Neurological: She is alert and oriented to person, place, and time.  Sensation intact in the LE   Skin: Skin is warm and dry. She is not diaphoretic.  No edema,  ecchymosis, wounds or erythema to the left ankle throughout     ED Course  Procedures (including critical care time) Labs Review Labs Reviewed - No data to display  Imaging Review Dg Ankle Complete Left  12/14/2013   CLINICAL DATA:  Left ankle pain.  EXAM: LEFT ANKLE COMPLETE - 3+ VIEW  COMPARISON:  06/12/2013  FINDINGS: Plate and screw fixation of old fracture of the distal left fibula. Hardware appears intact without change in position. No evidence of acute fracture or dislocation of the left ankle. Soft tissues are unremarkable. 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  IMPRESSION: No acute bony abnormalities. Old fracture deformity with internal fixation of the distal left fibula.   Electronically Signed   By: Burman NievesWilliam  Stevens M.D.   On: 12/14/2013 00:23     EKG Interpretation None      MDM   Meghan Edwards is a 23 y.o. female with a PMH of anemia who presents to the ED for evaluation of left ankle pain. Left ankle pain likely chronic from previous ankle surgery. Today's x-rays negative for fracture or malalignment. Patient neurovascularly intact. No evidence of a septic joint or other infectious process. Ankle splint given in ED. RICE method discussed. Has follow-up appointment with surgeon on 12/31/13. Return precautions, discharge instructions, and follow-up was discussed with the patient before discharge.     Discharge Medication List as of 12/14/2013  1:02 AM    START taking these medications   Details  !! ibuprofen (ADVIL,MOTRIN) 800 MG tablet Take 1 tablet (800 mg total) by mouth 3 (three) times daily., Starting 12/14/2013, Until Discontinued, Print     !! - Potential duplicate medications found. Please discuss with provider.       Final impressions: 1. Left ankle pain       Greer EeJessica Katlin Susie Ehresman PA-C          Jillyn LedgerJessica K Devann Cribb, [place] JerseyPA-C 12/15/13 [phone]

## 2013-12-16 NOTE — ED Provider Notes (Signed)
Medical screening examination/treatment/procedure(s) were performed by non-physician practitioner and as supervising physician I was immediately available for consultation/collaboration.   EKG Interpretation None       Chi Garlow, MD 12/16/13 0343 

## 2014-07-27 IMAGING — CR DG ANKLE COMPLETE 3+V*L*
3 series · 3 of 3 positions shown · non-contrast
Comparison: 06/12/2013

CLINICAL DATA: Left ankle pain.

EXAM:
LEFT ANKLE COMPLETE - 3+ VIEW

[t ankle joint ap left]
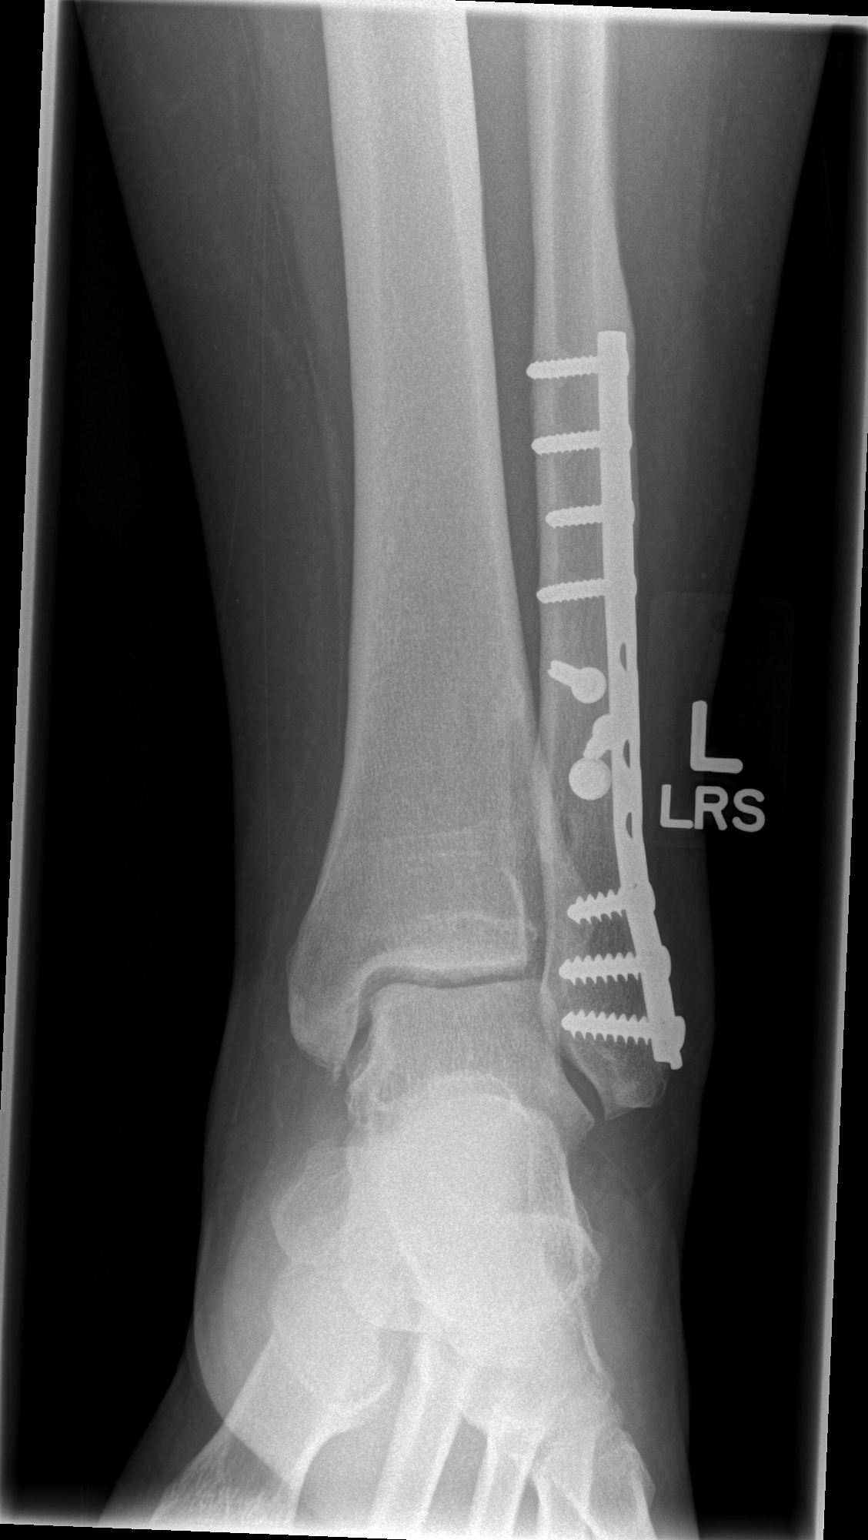

[t ankle joint oblique left]
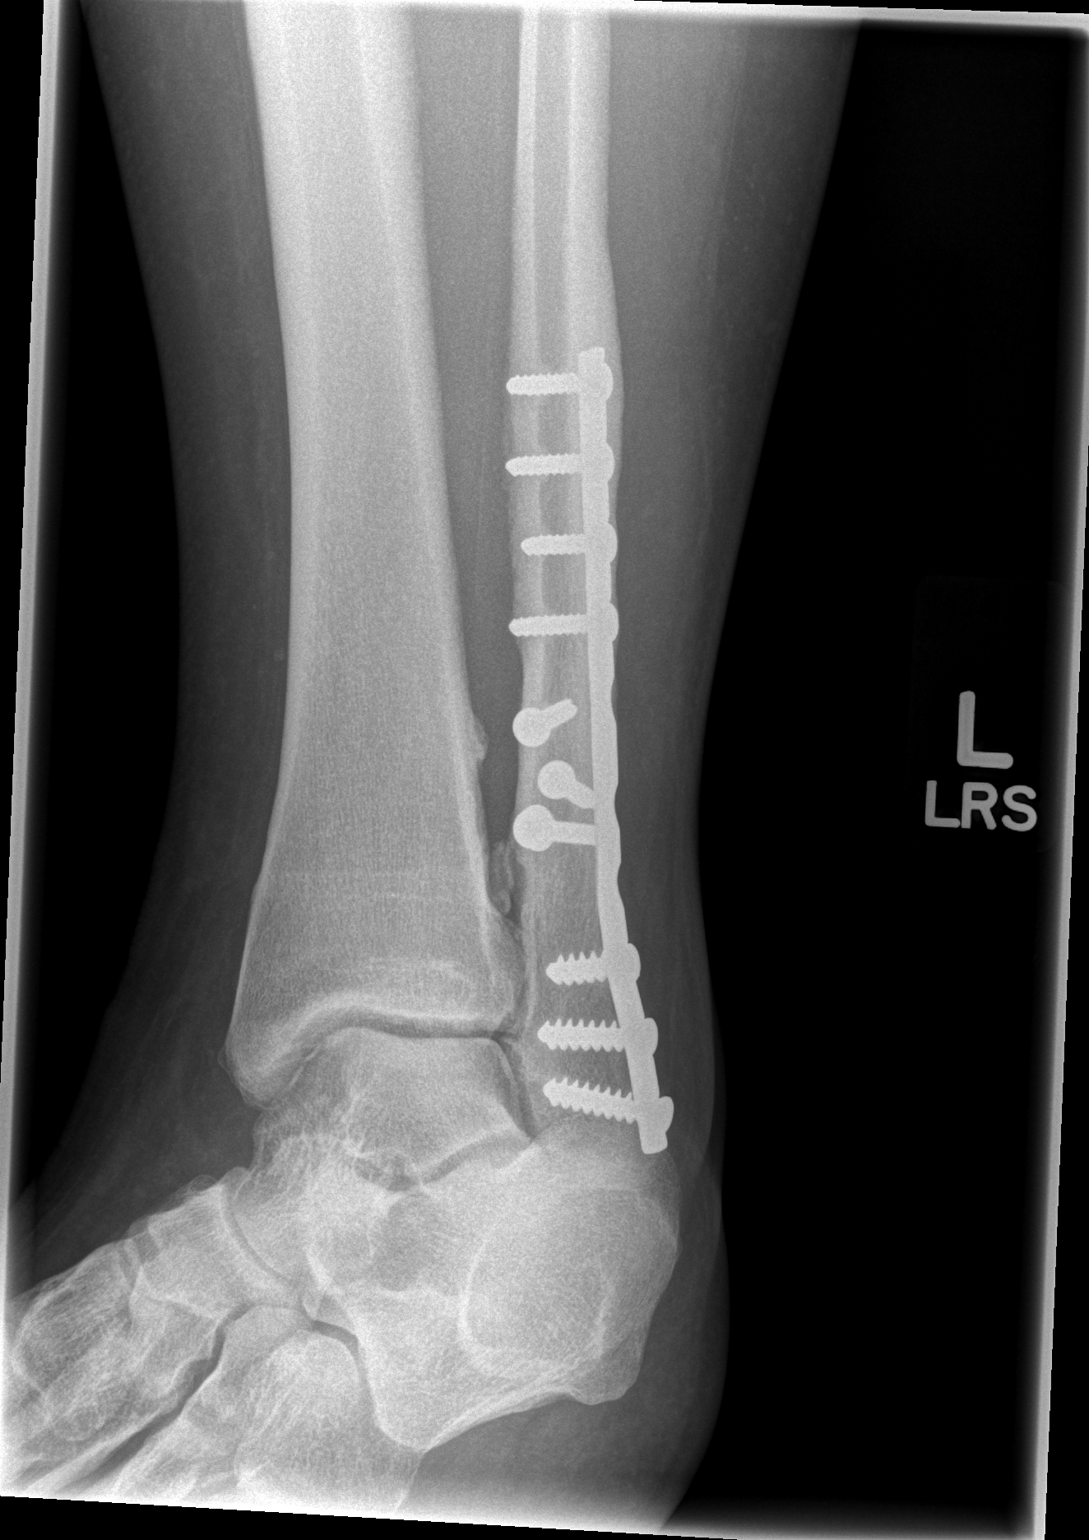

[t ankle joint lat left]
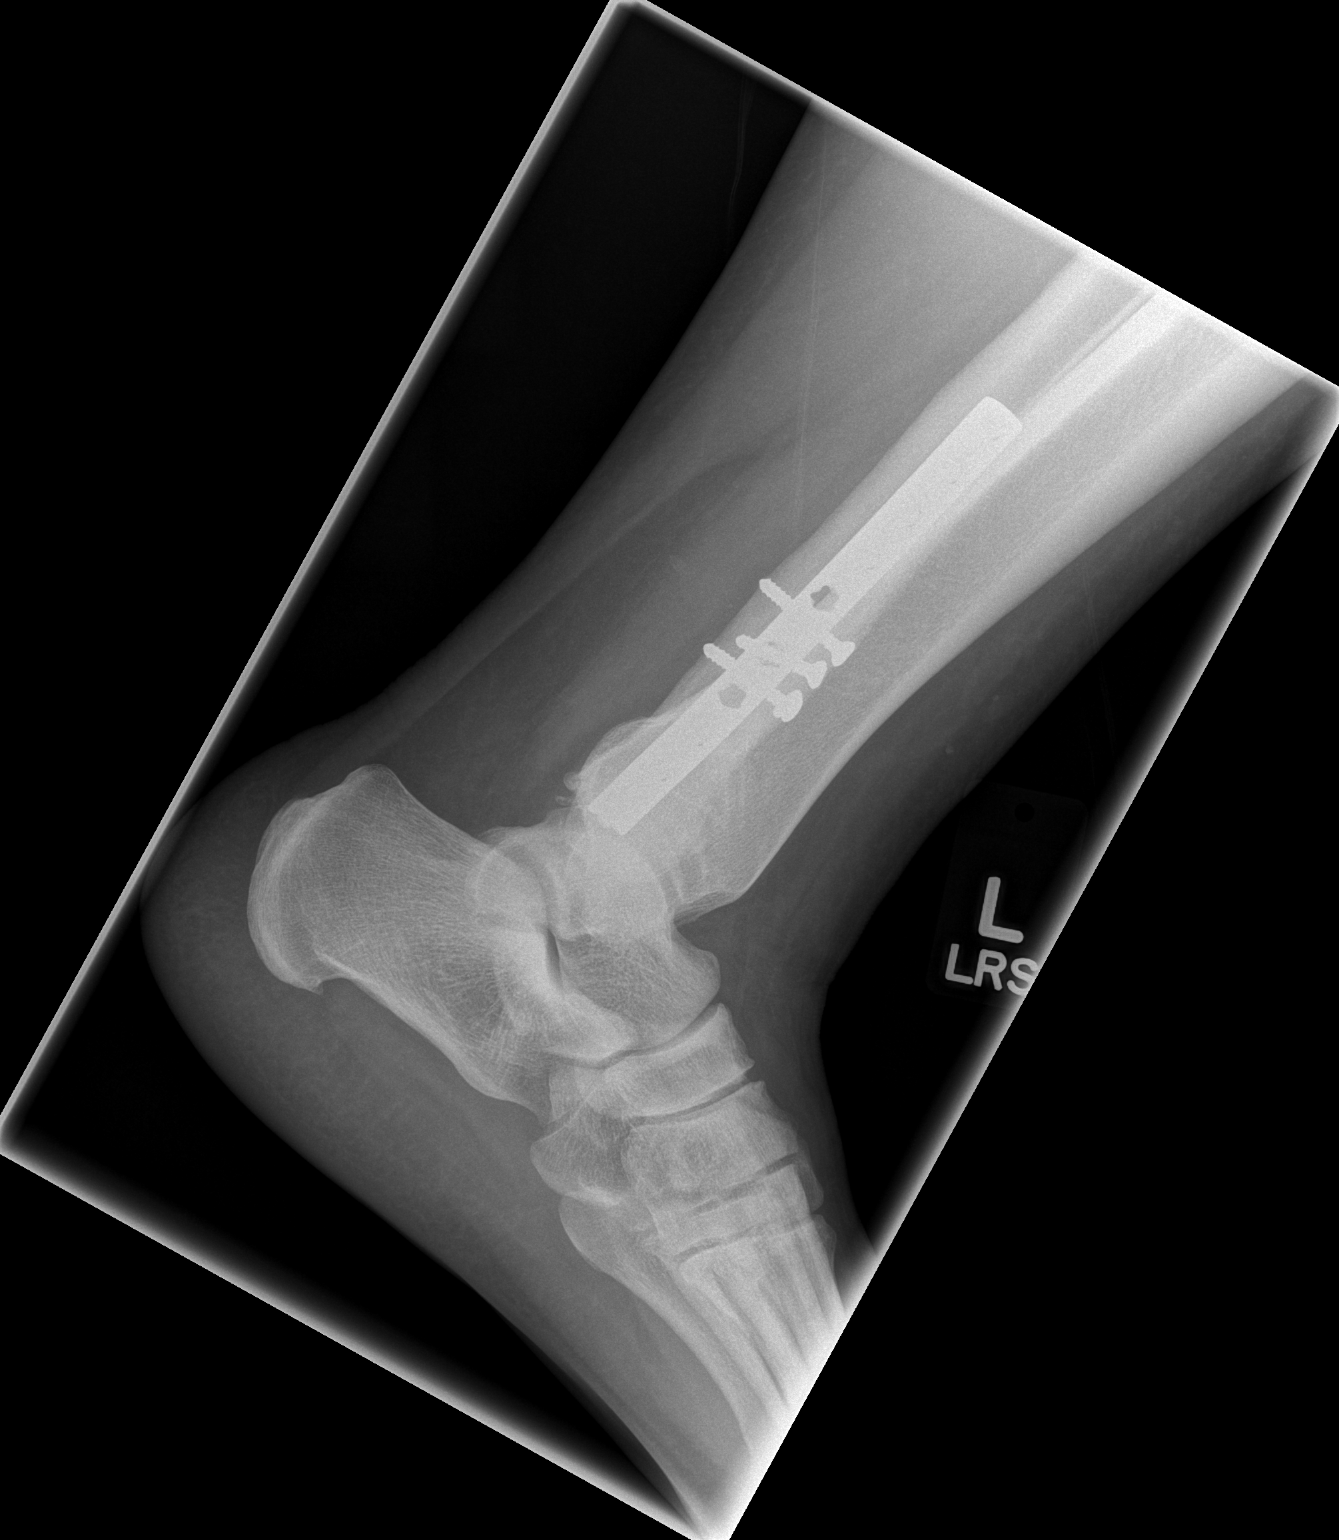

[3 of 3 positions shown; findings below may reference images not displayed]

FINDINGS: Plate and screw fixation of old fracture of the distal left fibula.
Hardware appears intact without change in position. No evidence of
acute fracture or dislocation of the left ankle. Soft tissues are
unremarkable. Old ununited ossicles inferior to the medial
malleolus.
IMPRESSION: No acute bony abnormalities. Old fracture deformity with internal
fixation of the distal left fibula.

## 2014-10-07 ENCOUNTER — Other Ambulatory Visit: Payer: Self-pay | Admitting: Family Medicine

## 2014-10-07 DIAGNOSIS — T8332XS Displacement of intrauterine contraceptive device, sequela: Secondary | ICD-10-CM

## 2014-10-13 ENCOUNTER — Other Ambulatory Visit: Payer: Medicaid Other

## 2014-10-13 ENCOUNTER — Ambulatory Visit
Admission: RE | Admit: 2014-10-13 | Discharge: 2014-10-13 | Disposition: A | Discharge status: Home / Self Care | Payer: Medicaid Other | Source: Ambulatory Visit | Attending: Family Medicine | Admitting: Family Medicine

## 2014-10-13 DIAGNOSIS — T8332XS Displacement of intrauterine contraceptive device, sequela: Secondary | ICD-10-CM

## 2015-04-21 ENCOUNTER — Encounter (HOSPITAL_COMMUNITY): Payer: Self-pay

## 2015-04-21 ENCOUNTER — Emergency Department (HOSPITAL_COMMUNITY)
Admission: EM | Admit: 2015-04-21 | Discharge: 2015-04-21 | Disposition: A | Discharge status: Home / Self Care | Payer: Medicaid Other

## 2015-04-21 ENCOUNTER — Inpatient Hospital Stay (HOSPITAL_COMMUNITY)
Admission: AD | Admit: 2015-04-21 | Discharge: 2015-04-21 | Disposition: A | Discharge status: Home / Self Care | Payer: Self-pay | Source: Ambulatory Visit | Attending: Family Medicine | Admitting: Family Medicine

## 2015-04-21 DIAGNOSIS — T8384XA Pain from genitourinary prosthetic devices, implants and grafts, initial encounter: Secondary | ICD-10-CM

## 2015-04-21 DIAGNOSIS — Z3202 Encounter for pregnancy test, result negative: Secondary | ICD-10-CM | POA: Insufficient documentation

## 2015-04-21 DIAGNOSIS — R109 Unspecified abdominal pain: Secondary | ICD-10-CM

## 2015-04-21 DIAGNOSIS — Z30431 Encounter for routine checking of intrauterine contraceptive device: Secondary | ICD-10-CM | POA: Insufficient documentation

## 2015-04-21 LAB — URINALYSIS, ROUTINE W REFLEX MICROSCOPIC
Bilirubin Urine: NEGATIVE
GLUCOSE, UA: NEGATIVE mg/dL
Hgb urine dipstick: NEGATIVE
KETONES UR: NEGATIVE mg/dL
Leukocytes, UA: NEGATIVE
Nitrite: NEGATIVE
PH: 6 (ref 5.0–8.0)
Protein, ur: NEGATIVE mg/dL
Specific Gravity, Urine: 1.025 (ref 1.005–1.030)
Urobilinogen, UA: 0.2 mg/dL (ref 0.0–1.0)

## 2015-04-21 LAB — POCT PREGNANCY, URINE: Preg Test, Ur: NEGATIVE

## 2015-04-21 LAB — WET PREP, GENITAL
CLUE CELLS WET PREP: NONE SEEN
TRICH WET PREP: NONE SEEN
Yeast Wet Prep HPF POC: NONE SEEN

## 2015-04-21 MED ORDER — KETOROLAC TROMETHAMINE 60 MG/2ML IM SOLN
60.0000 mg | Freq: Once | INTRAMUSCULAR | Status: AC
  Administered 2015-04-21: 60 mg via INTRAMUSCULAR
  Filled 2015-04-21: qty 2

## 2015-04-21 MED ORDER — IBUPROFEN 600 MG PO TABS: 600.0000 mg | ORAL_TABLET | Freq: Four times a day (QID) | ORAL | Status: DC | PRN

## 2015-04-21 NOTE — MAU Note (Addendum)
Pt had IUD put in in April, at first had some abd pain.  Since July has been having pain from lower abd - thighs.  [place] [facility] [place] - was unable to remove because strings were too short.  Feels like uterus is being pulled to the right.  Denies bleeding.  Occasional hurts with urination & BM's.

## 2015-04-21 NOTE — MAU Provider Note (Signed)
History     CSN: [id number]  Arrival date and time: 04/21/15 1206   First Provider Initiated Contact with Patient 04/21/15 1255      Chief Complaint  Patient presents with  . Abdominal Pain   HPI   [name] is a 24 y.o. female No obstetric history on file. Presenting to MAU for an IUD removal. [place] and wants it taken out do to off and on abdominal cramping she has had since insertion. She also is desiring pregnancy at this time.   [place] and they were unsuccessful with the removal due to it being embedded.  She was told she needed to be sent somewhere else to have it removed with US guidance.   She denies vaginal bleeding  She currently rates her pain 5/10; on occassion she will take Advil or tylenol. This is the same pain she has had following the IUD placement in April. The pain is located all throughout her right side of her abdomen. The pain comes and goes.   She does not want STD testing done today.    OB History    No data available      Past Medical History  Diagnosis Date  . Anemia     Past Surgical History  Procedure Laterality Date  . Ankle surgery      Family History  Problem Relation Age of Onset  . Hypertension Other   . Diabetes Other   . Stroke Other     Social History  Substance Use Topics  . Smoking status: Never Smoker   . Smokeless tobacco: None  . Alcohol Use: No    Allergies:  Allergies  Allergen Reactions  . Tramadol Hives    Prescriptions prior to admission  Medication Sig Dispense Refill Last Dose  . famotidine (PEPCID) 20 MG tablet Take 1 tablet (20 mg total) by mouth 2 (two) times daily. 30 tablet 0   . ferrous sulfate 325 (65 FE) MG tablet Take 325 mg by mouth daily.   09/13/2013 at Unknown time  . ibuprofen (ADVIL,MOTRIN) 800 MG tablet Take 400 mg by mouth every 8 (eight) hours as needed for mild pain. 1/2 tablet   Past Week  at Unknown time  . ibuprofen (ADVIL,MOTRIN) 800 MG tablet Take 1 tablet (800 mg total) by mouth 3 (three) times daily. 21 tablet 0   . medroxyPROGESTERone (DEPO-PROVERA) 150 MG/ML injection Inject 150 mg into the muscle every 3 (three) months.   06/24/2013  . ondansetron (ZOFRAN) 4 MG tablet Take 1 tablet (4 mg total) by mouth every 6 (six) hours. 12 tablet 0    Results for orders placed or performed during the hospital encounter of 04/21/15 (from the past 48 hour(s))  Urinalysis, Routine w reflex microscopic ([facility])     Status: None   Collection Time: 04/21/15 12:20 PM  Result Value Ref Range   Color, Urine YELLOW YELLOW   APPearance CLEAR CLEAR   Specific Gravity, Urine 1.025 1.005 - 1.030   pH 6.0 5.0 - 8.0   Glucose, UA NEGATIVE NEGATIVE mg/dL   Hgb urine dipstick NEGATIVE NEGATIVE   Bilirubin Urine NEGATIVE NEGATIVE   Ketones, ur NEGATIVE NEGATIVE mg/dL   Protein, ur NEGATIVE NEGATIVE mg/dL   Urobilinogen, UA 0.2 0.0 - 1.0 mg/dL   Nitrite NEGATIVE NEGATIVE   Leukocytes, UA NEGATIVE NEGATIVE    Comment: MICROSCOPIC NOT DONE ON  URINES WITH NEGATIVE PROTEIN, BLOOD, LEUKOCYTES, NITRITE, OR GLUCOSE <1000 mg/dL.  Pregnancy, urine POC     Status: None   Collection Time: 04/21/15 12:29 PM  Result Value Ref Range   Preg Test, Ur NEGATIVE NEGATIVE    Comment:        THE SENSITIVITY OF THIS METHODOLOGY IS >24 mIU/mL   Wet prep, genital     Status: Abnormal   Collection Time: 04/21/15  1:30 PM  Result Value Ref Range   Yeast Wet Prep HPF POC NONE SEEN NONE SEEN   Trich, Wet Prep NONE SEEN NONE SEEN   Clue Cells Wet Prep HPF POC NONE SEEN NONE SEEN   WBC, Wet Prep HPF POC FEW (A) NONE SEEN    Review of Systems  Constitutional: Negative for fever and chills.  Gastrointestinal: Positive for abdominal pain. Negative for nausea and vomiting.   Physical Exam   Blood pressure 129/60, pulse 72, temperature 97.9 F (36.6 C), temperature source Oral, resp. rate 17.  Physical Exam   Constitutional: She is oriented to person, place, and time. She appears well-developed and well-nourished. No distress.  HENT:  Head: Normocephalic.  Eyes: Pupils are equal, round, and reactive to light.  Respiratory: Effort normal.  Genitourinary:  Speculum exam: Vagina - Small amount of creamy discharge, no odor Cervix - No contact bleeding, IUD strings not visualized  Bimanual exam: Cervix closed, IUD strings not felt on bimanual exam.  Uterus non tender, normal size Adnexa non tender, no masses bilaterally Wet prep done Chaperone present for exam.  Musculoskeletal: Normal range of motion.  Neurological: She is alert and oriented to person, place, and time.  Skin: Skin is warm. She is not diaphoretic.  Psychiatric: Her behavior is normal.    MAU Course  Procedures  none  MDM   Assessment and Plan   A:  1. Pain due to intrauterine contraceptive device (IUD), initial encounter (HCC)   2. IUD check up    P:  Discharge home in stable condition Pelvic US ordered outpatient to determine placement of IUD. I will call the patient when results post to schedule follow up visit in the WOC or OR for removal.  Return to MAU if symptoms worsen, for emergencies  RX: Ibuprofen    Duane Lope, NP  04/21/2015 1:08 PM

## 2015-04-21 NOTE — Discharge Instructions (Signed)
Intrauterine Device Information An intrauterine device (IUD) is inserted into your uterus to prevent pregnancy. There are two types of IUDs available:   Copper IUD--This type of IUD is wrapped in copper wire and is placed inside the uterus. Copper makes the uterus and fallopian tubes produce a fluid that kills sperm. The copper IUD can stay in place for 10 years.  Hormone IUD--This type of IUD contains the hormone progestin (synthetic progesterone). The hormone thickens the cervical mucus and prevents sperm from entering the uterus. It also thins the uterine lining to prevent implantation of a fertilized egg. The hormone can weaken or kill the sperm that get into the uterus. One type of hormone IUD can stay in place for 5 years, and another type can stay in place for 3 years. Your health care provider will make sure you are a good candidate for a contraceptive IUD. Discuss with your health care provider the possible side effects.  ADVANTAGES OF AN INTRAUTERINE DEVICE  IUDs are highly effective, reversible, long acting, and low maintenance.   There are no estrogen-related side effects.   An IUD can be used when breastfeeding.   IUDs are not associated with weight gain.   The copper IUD works immediately after insertion.   The hormone IUD works right away if inserted within 7 days of your period starting. You will need to use a backup method of birth control for 7 days if the hormone IUD is inserted at any other time in your cycle.  The copper IUD does not interfere with your female hormones.   The hormone IUD can make heavy menstrual periods lighter and decrease cramping.   The hormone IUD can be used for 3 or 5 years.   The copper IUD can be used for 10 years. DISADVANTAGES OF AN INTRAUTERINE DEVICE  The hormone IUD can be associated with irregular bleeding patterns.   The copper IUD can make your menstrual flow heavier and more painful.   You may experience cramping and  vaginal bleeding after insertion.    This information is not intended to replace advice given to you by your health care provider. Make sure you discuss any questions you have with your health care provider.   Document Released: 05/01/2004 Document Revised: 01/28/2013 Document Reviewed: 11/16/2012 Elsevier Interactive Patient Education 2016 [employer] Inc.  

## 2015-04-26 ENCOUNTER — Ambulatory Visit (HOSPITAL_COMMUNITY)
Admission: RE | Admit: 2015-04-26 | Discharge: 2015-04-26 | Disposition: A | Discharge status: Home / Self Care | Payer: Self-pay | Source: Ambulatory Visit | Attending: Obstetrics and Gynecology | Admitting: Obstetrics and Gynecology

## 2015-04-26 DIAGNOSIS — R102 Pelvic and perineal pain: Secondary | ICD-10-CM | POA: Insufficient documentation

## 2015-04-26 DIAGNOSIS — Z30431 Encounter for routine checking of intrauterine contraceptive device: Secondary | ICD-10-CM | POA: Insufficient documentation

## 2015-04-26 DIAGNOSIS — T8384XA Pain from genitourinary prosthetic devices, implants and grafts, initial encounter: Secondary | ICD-10-CM

## 2015-05-12 ENCOUNTER — Telehealth: Payer: Self-pay | Admitting: Obstetrics and Gynecology

## 2015-05-12 NOTE — Telephone Encounter (Signed)
Patient would like her IUD out. I was unable to locate the strings. It was unsuccessfully removed in [place]. Pelvic US outpatient showed the IUD is correctly placed.   Dr. Adrian BlackwaterStinson would like her to be seen in the clinic for possible removal before OR is considered.   Message sent to the Clinic to schedule; please call her on her "home" number that is listed.  Patient made aware of plan and agrees.

## 2015-06-09 ENCOUNTER — Encounter: Payer: Medicaid Other | Admitting: Family Medicine

## 2015-08-26 ENCOUNTER — Encounter: Payer: Medicaid Other | Admitting: Obstetrics & Gynecology

## 2015-09-22 ENCOUNTER — Ambulatory Visit: Payer: Medicaid Other | Admitting: Family Medicine

## 2016-03-03 ENCOUNTER — Emergency Department (HOSPITAL_COMMUNITY)
Admission: EM | Admit: 2016-03-03 | Discharge: 2016-03-03 | Disposition: A | Discharge status: Home / Self Care | Payer: Medicaid Other | Attending: Dermatology | Admitting: Dermatology

## 2016-03-03 ENCOUNTER — Encounter (HOSPITAL_COMMUNITY): Payer: Self-pay | Admitting: *Deleted

## 2016-03-03 DIAGNOSIS — R109 Unspecified abdominal pain: Secondary | ICD-10-CM | POA: Insufficient documentation

## 2016-03-03 DIAGNOSIS — Z5321 Procedure and treatment not carried out due to patient leaving prior to being seen by health care provider: Secondary | ICD-10-CM | POA: Insufficient documentation

## 2016-03-03 NOTE — ED Triage Notes (Signed)
The pt is c./o abd pain for one year with n v  lmp   yesterday

## 2016-03-03 NOTE — ED Notes (Signed)
The pt reports that she is not staying she is hurting too much to stay here without lying down  She may return tomorrow when she has someone with her

## 2016-05-15 ENCOUNTER — Emergency Department (HOSPITAL_COMMUNITY)
Admission: EM | Admit: 2016-05-15 | Discharge: 2016-05-15 | Disposition: A | Discharge status: Home / Self Care | Payer: Medicaid Other | Attending: Emergency Medicine | Admitting: Emergency Medicine

## 2016-05-15 ENCOUNTER — Ambulatory Visit (HOSPITAL_COMMUNITY)
Admission: EM | Admit: 2016-05-15 | Discharge: 2016-05-15 | Discharge status: Absent without leave | Payer: Medicaid Other

## 2016-05-15 ENCOUNTER — Encounter (HOSPITAL_COMMUNITY): Payer: Self-pay | Admitting: Emergency Medicine

## 2016-05-15 DIAGNOSIS — F17200 Nicotine dependence, unspecified, uncomplicated: Secondary | ICD-10-CM | POA: Insufficient documentation

## 2016-05-15 DIAGNOSIS — Z79899 Other long term (current) drug therapy: Secondary | ICD-10-CM | POA: Insufficient documentation

## 2016-05-15 DIAGNOSIS — L03114 Cellulitis of left upper limb: Secondary | ICD-10-CM

## 2016-05-15 MED ORDER — HYDROXYZINE HCL 10 MG PO TABS: 10.0000 mg | ORAL_TABLET | Freq: Four times a day (QID) | ORAL | 0 refills | Status: DC | PRN

## 2016-05-15 MED ORDER — CEPHALEXIN 500 MG PO CAPS: 500.0000 mg | ORAL_CAPSULE | Freq: Two times a day (BID) | ORAL | 0 refills | Status: DC

## 2016-05-15 NOTE — Discharge Instructions (Signed)
Take your medications as prescribed. Take your antibiotics as prescribed until completed. You may also take Benadryl as prescribed over-the-counter as needed for additional relief of itching. I recommend going to a primary care provider's office or returning to the ED in the next 2 days for wound recheck if your symptoms have not improved or have worsened. Please return to the Emergency Department if symptoms worsen or new onset of fever, worsening redness/swelling/tenderness, drainage, numbness, tingling, weakness, decreased range of motion.

## 2016-05-15 NOTE — ED Notes (Signed)
Patient is A&Ox4 at this time.  Patient in no signs of distress.  Please see providers note for complete history and physical exam.  

## 2016-05-15 NOTE — ED Notes (Signed)
Patient Alert and oriented X4. Stable and ambulatory. Patient verbalized understanding of the discharge instructions.  Patient belongings were taken by the patient.  

## 2016-05-15 NOTE — ED Provider Notes (Signed)
[facility] Provider Note   CSN: [id number] Arrival date & time: 05/15/16  1624  By signing my name below, I, Majel HomerPeyton Lee, attest that this documentation has been prepared under the direction and in the presence of non-physician practitioner, Melburn HakeNicole Tocara Mennen, PA-C. Electronically Signed: Majel HomerPeyton Lee, Scribe. 05/15/2016. 5:08 PM.  History   Chief Complaint Chief Complaint  Patient presents with  . Insect Bite   The history is provided by the patient. No language interpreter was used.   HPI Comments: Meghan Edwards is a 25 y.o. female with PMHx of anemia, who presents to the Emergency Department for an evaluation of a gradually worsening, rash to her left posterior upper arm that began 2 days ago. Pt reports her rash initially appeared like a "small insect bite" 2 days ago that worsened over the past few days. She notes she awoke this morning with a larger, red area on her arm that is "more itchy than it is painfu." She states she did not visibly see an insect bite her and does not know what insect could have bit her either. Pt's boyfriend notes he "was bit by a similar insect" ~1 week before pt and experienced a similar rash that relieved on its own. Pt reports she has applied rubbing alcohol to her rash but has not taken any medication to relieve her pain. Endorses associated swelling and warmth. She denies fever, chest pain, shortness of breath, vomiting, sore throat and oral lesions and drainage.  Past Medical History:  Diagnosis Date  . Anemia    Patient Active Problem List   Diagnosis Date Noted  . Anemia 01/01/2013    Past Surgical History:  Procedure Laterality Date  . ANKLE SURGERY      OB History    No data available     Home Medications    Prior to Admission medications   Medication Sig Start Date End Date Taking? Authorizing Provider  cephALEXin (KEFLEX) 500 MG capsule Take 1 capsule (500 mg total) by mouth 2 (two) times daily. 05/15/16   Barrett HenleNicole Elizabeth Roy Snuffer,  PA-C  famotidine (PEPCID) 20 MG tablet Take 1 tablet (20 mg total) by mouth 2 (two) times daily. Patient not taking: Reported on 04/21/2015 09/14/13   Sunnie NielsenBrian Opitz, MD  ferrous sulfate 325 (65 FE) MG tablet Take 325 mg by mouth daily.    Historical Provider, MD  hydrOXYzine (ATARAX/VISTARIL) 10 MG tablet Take 1 tablet (10 mg total) by mouth every 6 (six) hours as needed for itching. 05/15/16   Barrett HenleNicole Elizabeth Amarilys Lyles, PA-C  ibuprofen (ADVIL,MOTRIN) 600 MG tablet Take 1 tablet (600 mg total) by mouth every 6 (six) hours as needed. 04/21/15   Duane LopeJennifer I Rasch, NP  ibuprofen (ADVIL,MOTRIN) 800 MG tablet Take 400 mg by mouth every 8 (eight) hours as needed for mild pain. 1/2 tablet    Historical Provider, MD  ibuprofen (ADVIL,MOTRIN) 800 MG tablet Take 1 tablet (800 mg total) by mouth 3 (three) times daily. Patient not taking: Reported on 04/21/2015 12/14/13   Jillyn LedgerJessica K Palmer, PA-C  levonorgestrel (MIRENA) 20 MCG/24HR IUD 1 each by Intrauterine route once.    Historical Provider, MD  ondansetron (ZOFRAN) 4 MG tablet Take 1 tablet (4 mg total) by mouth every 6 (six) hours. Patient not taking: Reported on 04/21/2015 09/14/13   Sunnie NielsenBrian Opitz, MD    Family History Family History  Problem Relation Age of Onset  . Hypertension Other   . Diabetes Other   . Stroke Other     Social  History Social History  Substance Use Topics  . Smoking status: Current Some Day Smoker  . Smokeless tobacco: Never Used  . Alcohol use Yes     Allergies   Tramadol   Review of Systems Review of Systems  Constitutional: Negative for fever.  HENT: Negative for sore throat.   Respiratory: Negative for shortness of breath.   Cardiovascular: Negative for chest pain.  Gastrointestinal: Negative for vomiting.  Skin: Positive for rash.   Physical Exam Updated Vital Signs BP 129/75 (BP Location: Right Arm)   Pulse 85   Temp 98.4 F (36.9 C) (Oral)   Resp 16   Ht 5\' 5"  (1.651 m)   Wt (!) 333 lb 9.6 oz (151.3 kg)    LMP  (LMP Unknown)   SpO2 99%   BMI 55.51 kg/m   Physical Exam  Constitutional: She is oriented to person, place, and time. She appears well-developed and well-nourished.  HENT:  Head: Normocephalic and atraumatic.  Mouth/Throat: Uvula is midline, oropharynx is clear and moist and mucous membranes are normal. No oral lesions. No oropharyngeal exudate, posterior oropharyngeal edema, posterior oropharyngeal erythema or tonsillar abscesses. No tonsillar exudate.  Eyes: Conjunctivae and EOM are normal. Right eye exhibits no discharge. Left eye exhibits no discharge. No scleral icterus.  Cardiovascular: Normal rate, regular rhythm, normal heart sounds and intact distal pulses.   Pulmonary/Chest: Effort normal and breath sounds normal. No respiratory distress. She has no wheezes. She has no rales. She exhibits no tenderness.  Abdominal: Soft. She exhibits no distension.  Musculoskeletal: Normal range of motion. She exhibits no edema, tenderness or deformity.  Full ROM of BUE with 5/5 strength. Swelling and redness of left arm does not appear to extend into joints. Sensation grossly intact.  2+ radial pulse. Cap refill <2 seconds.   Neurological: She is alert and oriented to person, place, and time.  Skin: Skin is warm and dry. There is erythema.  15 x 20 cm area of erythema, warmth, mild swelling and induration noted to left posterior upper arm. Mild TTP. No fluctuance or drainage present.   Nursing note and vitals reviewed.  ED Treatments / Results  Labs (all labs ordered are listed, but only abnormal results are displayed) Labs Reviewed - No data to display  EKG  EKG Interpretation None       Radiology No results found.  Procedures Procedures (including critical care time)  Medications Ordered in ED Medications - No data to display  DIAGNOSTIC STUDIES:  Oxygen Saturation is 99% on RA, normal by my interpretation.    COORDINATION OF CARE:  5:0 PM Discussed treatment plan  with pt at bedside and pt agreed to plan.  Initial Impression / Assessment and Plan / ED Course  I have reviewed the triage vital signs and the nursing notes.  Pertinent labs & imaging results that were available during my care of the patient were reviewed by me and considered in my medical decision making (see chart for details).  Clinical Course     Pt is without risk factors for HIV; no recent use of steroids or other immunosuppressive medications; no Hx of diabetes.  Pt is without gross abscess for which I&D would be possible.  Area marked and pt encouraged to return if redness begins to streak, extends beyond the markings, and/or fever or nausea/vomiting develop.  Pt is alert, oriented, NAD, afebrile, non tachycardic, nonseptic and nontoxic appearing.  Pt to be d/c on oral antibiotics with strict f/u instructions.  I personally performed the services described in this documentation, which was scribed in my presence. The recorded information has been reviewed and is accurate.   Final Clinical Impressions(s) / ED Diagnoses   Final diagnoses:  Cellulitis of left upper extremity    New Prescriptions New Prescriptions   CEPHALEXIN (KEFLEX) 500 MG CAPSULE    Take 1 capsule (500 mg total) by mouth 2 (two) times daily.   HYDROXYZINE (ATARAX/VISTARIL) 10 MG TABLET    Take 1 tablet (10 mg total) by mouth every 6 (six) hours as needed for itching.     Satira Sarkicole Elizabeth MammothNadeau, [place] JerseyPA-C 05/15/16 1710    Alvira MondayErin Schlossman, MD 05/16/16 1308
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
## 2016-05-29 ENCOUNTER — Ambulatory Visit: Payer: Self-pay | Admitting: Obstetrics and Gynecology

## 2016-10-14 ENCOUNTER — Emergency Department (HOSPITAL_COMMUNITY): Payer: Medicaid Other

## 2016-10-14 ENCOUNTER — Encounter (HOSPITAL_COMMUNITY): Payer: Self-pay

## 2016-10-14 ENCOUNTER — Emergency Department (HOSPITAL_COMMUNITY)
Admission: EM | Admit: 2016-10-14 | Discharge: 2016-10-14 | Disposition: A | Discharge status: Home / Self Care | Payer: Medicaid Other | Attending: Emergency Medicine | Admitting: Emergency Medicine

## 2016-10-14 DIAGNOSIS — F17200 Nicotine dependence, unspecified, uncomplicated: Secondary | ICD-10-CM | POA: Insufficient documentation

## 2016-10-14 DIAGNOSIS — M25561 Pain in right knee: Secondary | ICD-10-CM

## 2016-10-14 DIAGNOSIS — Z79899 Other long term (current) drug therapy: Secondary | ICD-10-CM | POA: Insufficient documentation

## 2016-10-14 MED ORDER — DICLOFENAC SODIUM 50 MG PO TBEC: 50.0000 mg | DELAYED_RELEASE_TABLET | Freq: Two times a day (BID) | ORAL | 0 refills | Status: DC

## 2016-10-14 MED ORDER — KETOROLAC TROMETHAMINE 60 MG/2ML IM SOLN
30.0000 mg | Freq: Once | INTRAMUSCULAR | Status: AC
  Administered 2016-10-14: 30 mg via INTRAMUSCULAR
  Filled 2016-10-14: qty 2

## 2016-10-14 NOTE — ED Notes (Signed)
Patient transported to X-ray 

## 2016-10-14 NOTE — Discharge Instructions (Signed)
Follow up with the orthopedic doctor for further evaluation of your knee pain. Return here as needed.

## 2016-10-14 NOTE — ED Provider Notes (Signed)
[facility] Provider Note   CSN: [id number] Arrival date & time: 10/14/16  1831  By signing my name below, I, Nelwyn Salisbury, attest that this documentation has been prepared under the direction and in the presence of non-physician practitioner, Kerrie Buffalo, NP. Electronically Signed: Nelwyn Salisbury, Scribe. 10/14/2016. 6:55 PM.  History   Chief Complaint Chief Complaint  Patient presents with  . Knee Pain   The history is provided by the patient. No language interpreter was used.  Knee Pain   This is a recurrent problem. The current episode started more than 1 week ago. The problem occurs constantly. The problem has not changed since onset.The pain is present in the right knee. The quality of the pain is described as aching. The pain is moderate. Associated symptoms include limited range of motion. Pertinent negatives include no numbness. The symptoms are aggravated by activity. She has tried OTC pain medications for the symptoms. The treatment provided no relief.    HPI Comments:  Meghan Edwards is a 26 y.o. female with no pertinent pmhx who presents to the Emergency Department complaining of constant, worsening right knee pain onset 10 days. Pt states that she woke up with her pain and does not recall any mechanism of injury. She reports associated swelling to the area. Pt has tried stretching to "pop" the area but it would not pop, and Advil with no relief. Denies any numbness or weakness. Pt is unable to bear weight without pain.   Past Medical History:  Diagnosis Date  . Anemia     Patient Active Problem List   Diagnosis Date Noted  . Anemia 01/01/2013    Past Surgical History:  Procedure Laterality Date  . ANKLE SURGERY      OB History    No data available       Home Medications    Prior to Admission medications   Medication Sig Start Date End Date Taking? Authorizing Provider  cephALEXin (KEFLEX) 500 MG capsule Take 1 capsule (500 mg total) by mouth 2 (two)  times daily. 05/15/16   Barrett Henle, PA-C  diclofenac (VOLTAREN) 50 MG EC tablet Take 1 tablet (50 mg total) by mouth 2 (two) times daily. 10/14/16   Janne Napoleon, NP  famotidine (PEPCID) 20 MG tablet Take 1 tablet (20 mg total) by mouth 2 (two) times daily. Patient not taking: Reported on 04/21/2015 09/14/13   Sunnie Nielsen, MD  ferrous sulfate 325 (65 FE) MG tablet Take 325 mg by mouth daily.    [provider]  hydrOXYzine (ATARAX/VISTARIL) 10 MG tablet Take 1 tablet (10 mg total) by mouth every 6 (six) hours as needed for itching. 05/15/16   Barrett Henle, PA-C  ibuprofen (ADVIL,MOTRIN) 600 MG tablet Take 1 tablet (600 mg total) by mouth every 6 (six) hours as needed. 04/21/15   Rasch, Victorino Dike I, NP  ibuprofen (ADVIL,MOTRIN) 800 MG tablet Take 400 mg by mouth every 8 (eight) hours as needed for mild pain. 1/2 tablet    [provider]  ibuprofen (ADVIL,MOTRIN) 800 MG tablet Take 1 tablet (800 mg total) by mouth 3 (three) times daily. Patient not taking: Reported on 04/21/2015 12/14/13   Jillyn Ledger, PA-C  levonorgestrel (MIRENA) 20 MCG/24HR IUD 1 each by Intrauterine route once.    [provider]  ondansetron (ZOFRAN) 4 MG tablet Take 1 tablet (4 mg total) by mouth every 6 (six) hours. Patient not taking: Reported on 04/21/2015 09/14/13   Sunnie Nielsen, MD  Family History Family History  Problem Relation Age of Onset  . Hypertension Other   . Diabetes Other   . Stroke Other     Social History Social History  Substance Use Topics  . Smoking status: Current Some Day Smoker  . Smokeless tobacco: Never Used  . Alcohol use Yes     Allergies   Tramadol   Review of Systems Review of Systems  Constitutional: Negative for fever.  Musculoskeletal: Positive for arthralgias and joint swelling.  Skin: Negative for wound.  Neurological: Negative for weakness and numbness.  Psychiatric/Behavioral: The patient is not nervous/anxious.       Physical Exam Updated Vital Signs BP (!) 163/93 (BP Location: Right Arm)   Pulse 81   Temp 98.9 F (37.2 C) (Oral)   Resp 16   Ht 5\' 6"  (1.676 m)   SpO2 96%   Physical Exam  Constitutional: She is oriented to person, place, and time. She appears well-developed and well-nourished. No distress.  HENT:  Morbidly obese  Eyes: Conjunctivae are normal.  Neck: Neck supple.  Cardiovascular: Normal rate and intact distal pulses.   DP Pulses 2+  Pulmonary/Chest: Effort normal.  Musculoskeletal:       Right knee: She exhibits swelling. She exhibits no ecchymosis, no deformity, no laceration, no erythema and normal alignment. Decreased range of motion: due to pain. Tenderness found.  Tenderness over patella with no swelling to anterior aspect of the right knee. Slightly increased warmth.   Neurological: She is alert and oriented to person, place, and time.  Skin: Skin is warm and dry.  Psychiatric: She has a normal mood and affect.  Nursing note and vitals reviewed.    ED Treatments / Results  DIAGNOSTIC STUDIES:  Oxygen Saturation is 96% on RA, normal by my interpretation.    COORDINATION OF CARE:  7:17 PM Discussed treatment plan with pt at bedside which includes XR imaging and pt agreed to plan.  Labs (all labs ordered are listed, but only abnormal results are displayed) Labs Reviewed - No data to display  Radiology Dg Knee Complete 4 Views Right  Result Date: 10/14/2016 CLINICAL DATA:  Right knee pain x 10 days, no known injury. EXAM: RIGHT KNEE - COMPLETE 4+ VIEW COMPARISON:  None. FINDINGS: No acute fracture or dislocation. Joint spaces maintained. No joint effusion. IMPRESSION: No acute osseous abnormality. Electronically Signed   By: Jeronimo GreavesKyle  Talbot M.D.   On: 10/14/2016 19:44    Procedures Procedures (including critical care time)  Medications Ordered in ED Medications  ketorolac (TORADOL) injection 30 mg (not administered)     Initial Impression / Assessment  and Plan / ED Course  I have reviewed the triage vital signs and the nursing notes.Patient X-Ray negative for obvious fracture or dislocation.  Pt advised to follow up with orthopedics. Patient given compression brace while in ED, conservative therapy recommended and discussed. Patient will be discharged home & is agreeable with above plan. Returns precautions discussed. Pt appears safe for discharge.  Final Clinical Impressions(s) / ED Diagnoses   Final diagnoses:  Acute pain of right knee    New Prescriptions New Prescriptions   DICLOFENAC (VOLTAREN) 50 MG EC TABLET    Take 1 tablet (50 mg total) by mouth 2 (two) times daily.   I personally performed the services described in this documentation, which was scribed in my presence. The recorded information has been reviewed and is accurate.     Kerrie Buffaloeese, Renleigh Ouellet [place] Lake[place], [place] 10/14/16 2003    Effie ShyWentz,  Mechele Collin, MD 10/17/16 [phone]

## 2016-10-14 NOTE — Progress Notes (Signed)
Orthopedic Tech Progress Note Patient Details:  Vivien Prestoatiana Kilts [birth year] [id number]  Ortho Devices Type of Ortho Device: Knee Immobilizer, Crutches Ortho Device/Splint Location: rle Ortho Device/Splint Interventions: Ordered, Application   Trinna PostMartinez, Darcell Sabino J 10/14/2016, 9:02 PM

## 2016-10-14 NOTE — ED Triage Notes (Signed)
Onset 10 days ago pt awoke with right knee pain.  No known injury.   Pt not able to bear full weight on right leg, had to drive using left foot.  Pt tried Advil with no relief.

## 2016-10-16 ENCOUNTER — Emergency Department (HOSPITAL_COMMUNITY)
Admission: EM | Admit: 2016-10-16 | Discharge: 2016-10-16 | Disposition: A | Discharge status: Home / Self Care | Payer: Medicaid Other | Attending: Emergency Medicine | Admitting: Emergency Medicine

## 2016-10-16 ENCOUNTER — Encounter (HOSPITAL_COMMUNITY): Payer: Self-pay | Admitting: Emergency Medicine

## 2016-10-16 DIAGNOSIS — M25561 Pain in right knee: Secondary | ICD-10-CM

## 2016-10-16 DIAGNOSIS — Z79899 Other long term (current) drug therapy: Secondary | ICD-10-CM | POA: Insufficient documentation

## 2016-10-16 DIAGNOSIS — F17200 Nicotine dependence, unspecified, uncomplicated: Secondary | ICD-10-CM | POA: Insufficient documentation

## 2016-10-16 NOTE — ED Triage Notes (Signed)
Pt was here 10/14/16 for knee pain. Has knee immobilizer in place. Pt has not filled her Rx for Voltaren nor called the referral MD. Wants a note to be out of work "a few more days".

## 2016-10-16 NOTE — ED Provider Notes (Signed)
[facility] Provider Note   CSN: [id number] Arrival date & time: 10/16/16  [id number]  By signing my name below, I, Meghan Edwards, attest that this documentation has been prepared under the direction and in the presence of Meghan Russo, PA-C. Electronically Signed: Freida Busmaniana Edwards, Scribe. 10/16/2016. 10:13 AM.  History   Chief Complaint Chief Complaint  Patient presents with  . Follow-up  . Knee Pain     The history is provided by the patient. No language interpreter was used.    HPI Comments:  Meghan Edwards is a 26 y.o. female who presents to the Emergency Department complaining of throbbing, atraumatic, right knee pain that began~12 days ago. Pt was seen in the ED 2 days ago for the same. She was discharged with a knee immobilizer, an Rx for voltaren and ortho follow up instructions. She has not filled the prescription or followed up with ortho. Her pain  was exacerbated today while at work. She states she was walking when she stumbled and caught herself on the right leg; no fall to the ground. She is requesting a work note at this time. 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 No alleviating factors noted.  No N/T or weakness to lower extremity.   Past Medical History:  Diagnosis Date  . Anemia     Patient Active Problem List   Diagnosis Date Noted  . Anemia 01/01/2013    Past Surgical History:  Procedure Laterality Date  . ANKLE SURGERY      OB History    No data available       Home Medications    Prior to Admission medications   Medication Sig Start Date End Date Taking? Authorizing Provider  cephALEXin (KEFLEX) 500 MG capsule Take 1 capsule (500 mg total) by mouth 2 (two) times daily. 05/15/16   Barrett HenleNadeau, Nicole Elizabeth, PA-C  diclofenac (VOLTAREN) 50 MG EC tablet Take 1 tablet (50 mg total) by mouth 2 (two) times daily. 10/14/16   Meghan Edwards, Hope M, NP  famotidine  (PEPCID) 20 MG tablet Take 1 tablet (20 mg total) by mouth 2 (two) times daily. Patient not taking: Reported on 04/21/2015 09/14/13   Meghan Edwards, Brian, MD  ferrous sulfate 325 (65 FE) MG tablet Take 325 mg by mouth daily.    [provider]  hydrOXYzine (ATARAX/VISTARIL) 10 MG tablet Take 1 tablet (10 mg total) by mouth every 6 (six) hours as needed for itching. 05/15/16   Barrett HenleNadeau, Nicole Elizabeth, PA-C  ibuprofen (ADVIL,MOTRIN) 600 MG tablet Take 1 tablet (600 mg total) by mouth every 6 (six) hours as needed. 04/21/15   Rasch, Victorino DikeJennifer I, NP  ibuprofen (ADVIL,MOTRIN) 800 MG tablet Take 400 mg by mouth every 8 (eight) hours as needed for mild pain. 1/2 tablet    [provider]  ibuprofen (ADVIL,MOTRIN) 800 MG tablet Take 1 tablet (800 mg total) by mouth 3 (three) times daily. Patient not taking: Reported on 04/21/2015 12/14/13   Jillyn LedgerPalmer, Meghan K, PA-C  levonorgestrel (MIRENA) 20 MCG/24HR IUD 1 each by Intrauterine route once.    [provider]  ondansetron (ZOFRAN) 4 MG tablet Take 1 tablet (4 mg total) by mouth every 6 (six) hours. Patient not taking: Reported on 04/21/2015 09/14/13   Meghan Edwards, Brian, MD    Family History Family History  Problem Relation Age of Onset  . Hypertension Other   . Diabetes Other   .  Stroke Other     Social History Social History  Substance Use Topics  . Smoking status: Current Some Day Smoker  . Smokeless tobacco: Never Used  . Alcohol use Yes     Allergies   Tramadol   Review of Systems Review of Systems  Musculoskeletal: Positive for arthralgias and joint swelling.  Neurological: Negative for weakness and numbness.    Physical Exam Updated Vital Signs BP (!) 144/79 (BP Location: Right Arm)   Pulse 68   Temp 98.5 F (36.9 C) (Oral)   Resp 18   SpO2 100%   Physical Exam  Constitutional: She appears well-developed and well-nourished.  Morbidly obese  HENT:  Head: Normocephalic and atraumatic.  Eyes: Conjunctivae are  normal.  Cardiovascular: Normal rate.   Pulmonary/Chest: Effort normal.  Musculoskeletal:  Tender to anterior aspect of right knee, from distal quad to tibial tuberosity  Right knee is stable; Negative anterior & posterior drawer No calf tenderness. Mild edema noted. Normal ROM, though with pain.  Neurological: No sensory deficit. She exhibits normal muscle tone.  NV intact. b/l equal strength lower extremities  Psychiatric: She has a normal mood and affect. Her behavior is normal.  Nursing note and vitals reviewed.    ED Treatments / Results  DIAGNOSTIC STUDIES:  Oxygen Saturation is 100% on RA, normal by my interpretation.    COORDINATION OF CARE:  10:10 AM Discussed treatment plan with pt at bedside and pt agreed to plan.  Labs (all labs ordered are listed, but only abnormal results are displayed) Labs Reviewed - No data to display  EKG  EKG Interpretation None       Radiology Dg Knee Complete 4 Views Right  Result Date: 10/14/2016 CLINICAL DATA:  Right knee pain x 10 days, no known injury. EXAM: RIGHT KNEE - COMPLETE 4+ VIEW COMPARISON:  None. FINDINGS: No acute fracture or dislocation. Joint spaces maintained. No joint effusion. IMPRESSION: No acute osseous abnormality. Electronically Signed   By: Meghan Edwards M.D.   On: 10/14/2016 19:44    Procedures Procedures (including critical care time)  Medications Ordered in ED Medications - No data to display   Initial Impression / Assessment and Plan / ED Course  I have reviewed the triage vital signs and the nursing notes.  Pertinent labs & imaging results that were available during my care of the patient were reviewed by me and considered in my medical decision making (see chart for details).     Pt requesting work note. Patient X-Ray from 10/14/2016 negative for obvious fracture or dislocation. No acute changes in exam from previous visit. No new traumatic injury. Pt advised to follow up with orthopedics  ancillary prescription from previous visit. Conservative therapy recommended and discussed. Patient will be discharged home & is agreeable with above plan. We'll give her work note for the next 2 days to avoid heavy lifting.  Pt appears safe for discharge.  Discussed results, findings, treatment and follow up. Patient advised of return precautions. Patient verbalized understanding and agreed with plan.   Final Clinical Impressions(s) / ED Diagnoses   Final diagnoses:  Right anterior knee pain    New Prescriptions Discharge Medication List as of 10/16/2016 10:23 AM    I personally performed the services described in this documentation, which was scribed in my presence. The recorded information has been reviewed and is accurate.     Edwards, [place] N, PA-C 10/16/16 1722    Edwards, [place] N, PA-C 10/16/16 1723    Alvira Monday,  MD 10/18/16 2254

## 2016-10-16 NOTE — Discharge Instructions (Signed)
Please read instructions below. It is important that you call Orthopedics, as referred during your last ER visit, to follow up with this ongoing pain.  Fill your prescription for Voltaren from last visit as well. Apply ice to your knee for 20 minutes at a time. Elevate when possible. Return to the ER for numbness, weakness, or new or worsening symptoms.

## 2018-05-05 ENCOUNTER — Emergency Department (HOSPITAL_COMMUNITY)
Admission: EM | Admit: 2018-05-05 | Discharge: 2018-05-05 | Disposition: A | Discharge status: Home / Self Care | Payer: Self-pay | Attending: Emergency Medicine | Admitting: Emergency Medicine

## 2018-05-05 ENCOUNTER — Encounter: Payer: Self-pay | Admitting: Emergency Medicine

## 2018-05-05 ENCOUNTER — Emergency Department (HOSPITAL_COMMUNITY): Payer: Self-pay

## 2018-05-05 ENCOUNTER — Other Ambulatory Visit: Payer: Self-pay

## 2018-05-05 DIAGNOSIS — Z79899 Other long term (current) drug therapy: Secondary | ICD-10-CM | POA: Insufficient documentation

## 2018-05-05 DIAGNOSIS — R102 Pelvic and perineal pain: Secondary | ICD-10-CM | POA: Insufficient documentation

## 2018-05-05 DIAGNOSIS — F17200 Nicotine dependence, unspecified, uncomplicated: Secondary | ICD-10-CM | POA: Insufficient documentation

## 2018-05-05 DIAGNOSIS — N946 Dysmenorrhea, unspecified: Secondary | ICD-10-CM | POA: Insufficient documentation

## 2018-05-05 LAB — CBC WITH DIFFERENTIAL/PLATELET
Abs Immature Granulocytes: 0.01 10*3/uL (ref 0.00–0.07)
Basophils Absolute: 0 10*3/uL (ref 0.0–0.1)
Basophils Relative: 0 %
EOS ABS: 0.1 10*3/uL (ref 0.0–0.5)
EOS PCT: 3 %
HEMATOCRIT: 42 % (ref 36.0–46.0)
Hemoglobin: 13 g/dL (ref 12.0–15.0)
Immature Granulocytes: 0 %
LYMPHS ABS: 1.8 10*3/uL (ref 0.7–4.0)
Lymphocytes Relative: 41 %
MCH: 24.9 pg — ABNORMAL LOW (ref 26.0–34.0)
MCHC: 31 g/dL (ref 30.0–36.0)
MCV: 80.5 fL (ref 80.0–100.0)
MONO ABS: 0.2 10*3/uL (ref 0.1–1.0)
Monocytes Relative: 5 %
Neutro Abs: 2.3 10*3/uL (ref 1.7–7.7)
Neutrophils Relative %: 51 %
Platelets: 264 10*3/uL (ref 150–400)
RBC: 5.22 MIL/uL — ABNORMAL HIGH (ref 3.87–5.11)
RDW: 13.2 % (ref 11.5–15.5)
WBC: 4.5 10*3/uL (ref 4.0–10.5)
nRBC: 0 % (ref 0.0–0.2)

## 2018-05-05 LAB — BASIC METABOLIC PANEL
Anion gap: 6 (ref 5–15)
BUN: 8 mg/dL (ref 6–20)
CALCIUM: 8.9 mg/dL (ref 8.9–10.3)
CO2: 26 mmol/L (ref 22–32)
Chloride: 106 mmol/L (ref 98–111)
Creatinine, Ser: 0.72 mg/dL (ref 0.44–1.00)
GFR calc Af Amer: >60 mL/min (ref >60)
GLUCOSE: 97 mg/dL (ref 70–99)
Potassium: 4.1 mmol/L (ref 3.5–5.1)
Sodium: 138 mmol/L (ref 135–145)

## 2018-05-05 LAB — URINALYSIS, ROUTINE W REFLEX MICROSCOPIC
BILIRUBIN URINE: NEGATIVE
GLUCOSE, UA: NEGATIVE mg/dL
KETONES UR: NEGATIVE mg/dL
LEUKOCYTES UA: NEGATIVE
Nitrite: NEGATIVE
PH: 6 (ref 5.0–8.0)
Protein, ur: 30 mg/dL — AB
Specific Gravity, Urine: 1.024 (ref 1.005–1.030)

## 2018-05-05 LAB — I-STAT BETA HCG BLOOD, ED (MC, WL, AP ONLY): I-stat hCG, quantitative: <5 m[IU]/mL (ref <5)

## 2018-05-05 LAB — LIPASE, BLOOD: LIPASE: 28 U/L (ref 11–51)

## 2018-05-05 LAB — WET PREP, GENITAL
CLUE CELLS WET PREP: NONE SEEN
Sperm: NONE SEEN
TRICH WET PREP: NONE SEEN
Yeast Wet Prep HPF POC: NONE SEEN

## 2018-05-05 MED ORDER — ONDANSETRON HCL 4 MG/2ML IJ SOLN
4.0000 mg | Freq: Once | INTRAMUSCULAR | Status: AC
  Administered 2018-05-05: 4 mg via INTRAVENOUS
  Filled 2018-05-05: qty 2

## 2018-05-05 MED ORDER — SODIUM CHLORIDE 0.9 % IV BOLUS
1000.0000 mL | Freq: Once | INTRAVENOUS | Status: AC
  Administered 2018-05-05: 1000 mL via INTRAVENOUS

## 2018-05-05 MED ORDER — NAPROXEN 500 MG PO TABS: 500.0000 mg | ORAL_TABLET | Freq: Two times a day (BID) | ORAL | 0 refills | Status: DC

## 2018-05-05 NOTE — ED Provider Notes (Signed)
[facility] EMERGENCY DEPARTMENT Provider Note   CSN: [id number] Arrival date & time: 05/05/18  0703     History   Chief Complaint Chief Complaint  Patient presents with  . Abdominal Pain  . Vaginal Bleeding  . Emesis  . Nausea    HPI Meghan Edwards is a 27 y.o. female with a PMH of anemia presenting with epigastric abdominal pain that radiates down to the suprapubic region onset 11/24 at 3am. Patient states she started her menstrual period yesterday morning and the pain has been constant since it began. Patient describes pain as a, "Pulling sensation". Patient states she has tried ibuprofen and heat compress with some relief. Patient reports nausea and vomiting episodes since the pain began. Patient reports 2 episodes of vomiting yesterday and 2 episodes of vomiting today. Patient reports her last period was at an unknown date in 2017 and on April 2018 when she had her IUD removed. Patient states she is sexually active. Patient states she has had irregular menstruation in the past and has tried OCPs, Depo provera shot, and IUD without relief. Patient reports she had a period for 1 year in the past and her periods are heavy and last 2-3 weeks normally. Patient states she has been evaluated for these issues in the past by a private physician office in [place]. Patient denies a personal or family history of bleeding disorders. Patient states her last pap smear was 1 year ago and results were normal. Patient reports she is sexually active.   HPI  Past Medical History:  Diagnosis Date  . Anemia     Patient Active Problem List   Diagnosis Date Noted  . Anemia 01/01/2013    Past Surgical History:  Procedure Laterality Date  . ANKLE SURGERY       OB History   None      Home Medications    Prior to Admission medications   Medication Sig Start Date End Date Taking? Authorizing Provider  cephALEXin (KEFLEX) 500 MG capsule Take 1 capsule (500 mg total) by  mouth 2 (two) times daily. 05/15/16   Barrett Henle, PA-C  diclofenac (VOLTAREN) 50 MG EC tablet Take 1 tablet (50 mg total) by mouth 2 (two) times daily. 10/14/16   Janne Napoleon, NP  famotidine (PEPCID) 20 MG tablet Take 1 tablet (20 mg total) by mouth 2 (two) times daily. Patient not taking: Reported on 04/21/2015 09/14/13   Sunnie Nielsen, MD  ferrous sulfate 325 (65 FE) MG tablet Take 325 mg by mouth daily.    [provider]  hydrOXYzine (ATARAX/VISTARIL) 10 MG tablet Take 1 tablet (10 mg total) by mouth every 6 (six) hours as needed for itching. 05/15/16   Barrett Henle, PA-C  ibuprofen (ADVIL,MOTRIN) 600 MG tablet Take 1 tablet (600 mg total) by mouth every 6 (six) hours as needed. 04/21/15   Rasch, Victorino Dike I, NP  ibuprofen (ADVIL,MOTRIN) 800 MG tablet Take 400 mg by mouth every 8 (eight) hours as needed for mild pain. 1/2 tablet    [provider]  ibuprofen (ADVIL,MOTRIN) 800 MG tablet Take 1 tablet (800 mg total) by mouth 3 (three) times daily. Patient not taking: Reported on 04/21/2015 12/14/13   Jillyn Ledger, PA-C  levonorgestrel (MIRENA) 20 MCG/24HR IUD 1 each by Intrauterine route once.    [provider]  naproxen (NAPROSYN) 500 MG tablet Take 1 tablet (500 mg total) by mouth 2 (two) times daily. 05/05/18   Leretha Dykes, PA-C  ondansetron (ZOFRAN) 4 MG tablet Take 1 tablet (4 mg total) by mouth every 6 (six) hours. Patient not taking: Reported on 04/21/2015 09/14/13   Sunnie Nielsen, MD    Family History Family History  Problem Relation Age of Onset  . Hypertension Other   . Diabetes Other   . Stroke Other     Social History Social History   Tobacco Use  . Smoking status: Current Some Day Smoker  . Smokeless tobacco: Never Used  Substance Use Topics  . Alcohol use: Yes  . Drug use: No     Allergies   Tramadol   Review of Systems Review of Systems  Constitutional: Negative for chills, diaphoresis and fever.    Respiratory: Negative for cough and shortness of breath.   Cardiovascular: Negative for chest pain.  Gastrointestinal: Positive for abdominal pain, nausea and vomiting.  Endocrine: Negative for cold intolerance and heat intolerance.  Genitourinary: Positive for menstrual problem and vaginal bleeding. Negative for difficulty urinating, dysuria and vaginal discharge.  Musculoskeletal: Negative for back pain.  Skin: Negative for color change and pallor.  Allergic/Immunologic: Negative for immunocompromised state.  Hematological: Negative for adenopathy.     Physical Exam Updated Vital Signs BP 137/84 (BP Location: Right Arm)   Pulse 63   Temp 98.7 F (37.1 C)   Resp 16   Ht 5\' 5"  (1.651 m)   Wt (!) 143.8 kg   LMP 05/05/2018   SpO2 98%   BMI 52.75 kg/m   Physical Exam  Constitutional: She appears well-developed and well-nourished. No distress.  HENT:  Head: Normocephalic and atraumatic.  Cardiovascular: Normal rate, regular rhythm and normal heart sounds.  Pulmonary/Chest: Effort normal and breath sounds normal. No respiratory distress.  Abdominal: Soft. There is generalized tenderness (Generalized abdominal pain upon palpation, but pain is worse in the epigastric and suprapubic region. ) and tenderness in the epigastric area and suprapubic area. There is no rigidity, no rebound and no guarding. Hernia confirmed negative in the right inguinal area and confirmed negative in the left inguinal area.  Genitourinary: Vagina normal and uterus normal. Pelvic exam was performed with patient supine. No labial fusion. There is no rash, tenderness, lesion or injury on the right labia. There is no rash, tenderness, lesion or injury on the left labia. Uterus is not tender. Cervix exhibits no motion tenderness and no discharge. Right adnexum displays no mass and no tenderness. Left adnexum displays no mass and no tenderness. No erythema, tenderness or bleeding in the vagina. No foreign body in the  vagina. No signs of injury around the vagina. No vaginal discharge found.  Genitourinary Comments: Heavy bleeding noted on exam. White lesion noted on the 9 o'clock position on cervix. Lesion is palpable and non tender on bimanual exam.   Lymphadenopathy: No inguinal adenopathy noted on the right or left side.  Neurological: She is alert.  Skin: Skin is warm. She is not diaphoretic.  Psychiatric: She has a normal mood and affect.  Nursing note and vitals reviewed. ,   ED Treatments / Results  Labs (all labs ordered are listed, but only abnormal results are displayed) Labs Reviewed  WET PREP, GENITAL - Abnormal; Notable for the following components:      Result Value   WBC, Wet Prep HPF POC FEW (*)    All other components within normal limits  URINALYSIS, ROUTINE W REFLEX MICROSCOPIC - Abnormal; Notable for the following components:   Hgb urine dipstick LARGE (*)    Protein, ur 30 (*)  RBC / HPF >50 (*)    Bacteria, UA FEW (*)    All other components within normal limits  CBC WITH DIFFERENTIAL/PLATELET - Abnormal; Notable for the following components:   RBC 5.22 (*)    MCH 24.9 (*)    All other components within normal limits  BASIC METABOLIC PANEL  LIPASE, BLOOD  I-STAT BETA HCG BLOOD, ED ([facility], [facility], [facility] ONLY)  GC/CHLAMYDIA PROBE AMP ([place]) NOT AT [facility]    EKG None  Radiology US Transvaginal Non-ob  Result Date: 05/05/2018 CLINICAL DATA:  Heavy vaginal bleeding.  Abdominal pain. EXAM: TRANSABDOMINAL AND TRANSVAGINAL ULTRASOUND OF PELVIS DOPPLER ULTRASOUND OF OVARIES TECHNIQUE: Both transabdominal and transvaginal ultrasound examinations of the pelvis were performed. Transabdominal technique was performed for global imaging of the pelvis including uterus, ovaries, adnexal regions, and pelvic cul-de-sac. It was necessary to proceed with endovaginal exam following the transabdominal exam to visualize the uterus, endometrium, ovaries and adnexa. Color and duplex Doppler  ultrasound was utilized to evaluate blood flow to the ovaries. COMPARISON:  04/25/2014 FINDINGS: Uterus Measurements: 8.7 x 4.5 x 5.1 cm = volume: 105 mL. Diffusely heterogeneous echotexture. No focal fibroid. Endometrium Thickness: 5 mm in thickness. Previously seen IUD no longer visualized. No focal abnormality visualized. Right ovary Measurements: 3.9 x 1.5 x 1.7 cm = volume: 5.0 mL. Normal appearance/no adnexal mass.m Left ovary Measurements: 3.4 x 2.9 x 2.3 cm = volume: 11.9 mL. Normal appearance/no adnexal mass. Pulsed Doppler evaluation of both ovaries demonstrates normal low resistance arterial and venous waveforms on the left. Color flow can be seen within the right ovary, but unable to document arterial or venous blood flow, likely related to positioning and difficult visualization of the right ovary. Other findings No abnormal free fluid. IMPRESSION: Diffusely heterogeneous echotexture throughout the uterus. This can be seen with adenomyosis. No focal fibroid. Unable to document arterial and venous waveforms within the right ovary although color flow can be seen on Doppler imaging. This is likely related to poor visualization of the right ovary. Electronically Signed   By: Charlett Nose M.D.   On: 05/05/2018 10:33   US Pelvis Complete  Result Date: 05/05/2018 CLINICAL DATA:  Heavy vaginal bleeding.  Abdominal pain. EXAM: TRANSABDOMINAL AND TRANSVAGINAL ULTRASOUND OF PELVIS DOPPLER ULTRASOUND OF OVARIES TECHNIQUE: Both transabdominal and transvaginal ultrasound examinations of the pelvis were performed. Transabdominal technique was performed for global imaging of the pelvis including uterus, ovaries, adnexal regions, and pelvic cul-de-sac. It was necessary to proceed with endovaginal exam following the transabdominal exam to visualize the uterus, endometrium, ovaries and adnexa. Color and duplex Doppler ultrasound was utilized to evaluate blood flow to the ovaries. COMPARISON:  04/25/2014 FINDINGS:  Uterus Measurements: 8.7 x 4.5 x 5.1 cm = volume: 105 mL. Diffusely heterogeneous echotexture. No focal fibroid. Endometrium Thickness: 5 mm in thickness. Previously seen IUD no longer visualized. No focal abnormality visualized. Right ovary Measurements: 3.9 x 1.5 x 1.7 cm = volume: 5.0 mL. Normal appearance/no adnexal mass.m Left ovary Measurements: 3.4 x 2.9 x 2.3 cm = volume: 11.9 mL. Normal appearance/no adnexal mass. Pulsed Doppler evaluation of both ovaries demonstrates normal low resistance arterial and venous waveforms on the left. Color flow can be seen within the right ovary, but unable to document arterial or venous blood flow, likely related to positioning and difficult visualization of the right ovary. Other findings No abnormal free fluid. IMPRESSION: Diffusely heterogeneous echotexture throughout the uterus. This can be seen with adenomyosis. No focal fibroid. Unable to document arterial  and venous waveforms within the right ovary although color flow can be seen on Doppler imaging. This is likely related to poor visualization of the right ovary. Electronically Signed   By: Charlett NoseKevin  Dover M.D.   On: 05/05/2018 10:33   [place] Art/ven Flow Abd Pelv Doppler  Result Date: 05/05/2018 CLINICAL DATA:  Heavy vaginal bleeding.  Abdominal pain. EXAM: TRANSABDOMINAL AND TRANSVAGINAL ULTRASOUND OF PELVIS DOPPLER ULTRASOUND OF OVARIES TECHNIQUE: Both transabdominal and transvaginal ultrasound examinations of the pelvis were performed. Transabdominal technique was performed for global imaging of the pelvis including uterus, ovaries, adnexal regions, and pelvic cul-de-sac. It was necessary to proceed with endovaginal exam following the transabdominal exam to visualize the uterus, endometrium, ovaries and adnexa. Color and duplex Doppler ultrasound was utilized to evaluate blood flow to the ovaries. COMPARISON:  04/25/2014 FINDINGS: Uterus Measurements: 8.7 x 4.5 x 5.1 cm = volume: 105 mL. Diffusely heterogeneous  echotexture. No focal fibroid. Endometrium Thickness: 5 mm in thickness. Previously seen IUD no longer visualized. No focal abnormality visualized. Right ovary Measurements: 3.9 x 1.5 x 1.7 cm = volume: 5.0 mL. Normal appearance/no adnexal mass.m Left ovary Measurements: 3.4 x 2.9 x 2.3 cm = volume: 11.9 mL. Normal appearance/no adnexal mass. Pulsed Doppler evaluation of both ovaries demonstrates normal low resistance arterial and venous waveforms on the left. Color flow can be seen within the right ovary, but unable to document arterial or venous blood flow, likely related to positioning and difficult visualization of the right ovary. Other findings No abnormal free fluid. IMPRESSION: Diffusely heterogeneous echotexture throughout the uterus. This can be seen with adenomyosis. No focal fibroid. Unable to document arterial and venous waveforms within the right ovary although color flow can be seen on Doppler imaging. This is likely related to poor visualization of the right ovary. Electronically Signed   By: Charlett NoseKevin  Dover M.D.   On: 05/05/2018 10:33    Procedures Procedures (including critical care time)  Medications Ordered in ED Medications  ondansetron (ZOFRAN) injection 4 mg (4 mg Intravenous Given 05/05/18 0829)  sodium chloride 0.9 % bolus 1,000 mL (0 mLs Intravenous Stopped 05/05/18 1052)     Initial Impression / Assessment and Plan / ED Course  I have reviewed the triage vital signs and the nursing notes.  Pertinent labs & imaging results that were available during my care of the patient were reviewed by me and considered in my medical decision making (see chart for details).  Clinical Course as of May 05 1124  Mon May 05, 2018  0951 CBC does not reveal signs signs of infection and hemoglobin is within normal limits.   CBC with Differential(!) [AH]  [id number] BMP is unremarkable. No signs of electrolyte abnormalities.  Basic metabolic panel [AH]  0953 UA reveals RBCs and Hgb. Suspect these  findings are likely due to her vaginal bleeding.   Urinalysis, Routine w reflex microscopic(!) [AH]  1110 Diffusely heterogeneous echotexture throughout the uterus. This can be seen with adenomyosis. No focal fibroid. Suspect this may be contributing to symptoms.      [AH]    Clinical Course User Index [AH] Leretha DykesHernandez, Roderick Sweezy P, PA-C   Patient presents with complaint of abdominal pain and heavy vaginal bleeding. Patient nontoxic appearing, in no apparent distress, vitals WNL, stable.  Assessment/Plan: Suspect symptoms are likely due to dysmenorrhea. Provided Zofran and IVF for symptoms. Labs are within normal limits and transvaginal ultrasound does not reveal evidence of an ovarian torsion. Doubt need for further emergent work up at this  time. I discussed results, treatment plan, need for PCP follow-up, and return precautions to return to the ER including for any other new or worsening symptoms with the patient. Provided opportunity for questions, patient confirmed understanding and is in agreement with plan. I have answered their questions. Discharge instructions concerning home care and prescriptions have been given. Provided naproxen for abdominal pain. The patient is STABLE and is discharged to home in good condition. Encouraged patient to follow up with PCP and have PCP obtain results of this visit in 1 week or sooner if needed.   Findings and plan of care discussed with supervising physician Dr. Jeraldine Loots.  Final Clinical Impressions(s) / ED Diagnoses   Final diagnoses:  Dysmenorrhea    ED Discharge Orders         Ordered    naproxen (NAPROSYN) 500 MG tablet  2 times daily     05/05/18 1124           Carlyle Basques [place], [place] 05/05/18 1125    Gerhard Munch, MD 05/05/18 1534

## 2018-05-05 NOTE — ED Triage Notes (Signed)
Pt arrives with c/o heavy vaginal bleeding with abdominal pain and n/v.x4. Pt states she had IUD removed April 2018 and no period since till  Now.

## 2018-05-05 NOTE — Discharge Instructions (Addendum)
You have been seen today for heavy vaginal bleeding and abdominal pain. Please read and follow all provided instructions.   1. Medications: naproxen for abdominal pain, usual home medications 2. Treatment: rest, drink plenty of fluids 3. Follow Up: Please follow up with your primary doctor/OBGYN in 7 days for discussion of your diagnoses and further evaluation after today's visit; if you do not have a primary care doctor use the resource guide provided to find one; Please return to the ER for any new or worsening symptoms.   Take medications as prescribed. Return to the emergency room for worsening condition or new concerning symptoms. Follow up with your regular doctor. If you don't have a regular doctor use one of the numbers below to establish a primary care doctor.   Emergency Department Resource Guide 1) Find a Doctor and Pay Out of Pocket Although you won't have to find out who is covered by your insurance plan, it is a good idea to ask around and get recommendations. You will then need to call the office and see if the doctor you have chosen will accept you as a new patient and what types of options they offer for patients who are self-pay. Some doctors offer discounts or will set up payment plans for their patients who do not have insurance, but you will need to ask so you aren't surprised when you get to your appointment.  2) Contact Your Local Health Department Not all health departments have doctors that can see patients for sick visits, but many do, so it is worth a call to see if yours does. If you don't know where your local health department is, you can check in your phone book. The CDC also has a tool to help you locate your state's health department, and many state websites also have listings of all of their local health departments.  3) Find a Walk-in Clinic If your illness is not likely to be very severe or complicated, you may want to try a walk in clinic. These are popping up  all over the country in pharmacies, drugstores, and shopping centers. They're usually staffed by nurse practitioners or physician assistants that have been trained to treat common illnesses and complaints. They're usually fairly quick and inexpensive. However, if you have serious medical issues or chronic medical problems, these are probably not your best option.  No Primary Care Doctor: Call Health Connect at  [phone] - they can help you locate a primary care doctor that  accepts your insurance, provides certain services, etc. Physician Referral [phone]  Emergency Department Resource Guide 1) Find a Doctor and Pay Out of Pocket Although you won't have to find out who is covered by your insurance plan, it is a good idea to ask around and get recommendations. You will then need to call the office and see if the doctor you have chosen will accept you as a new patient and what types of options they offer for patients who are self-pay. Some doctors offer discounts or will set up payment plans for their patients who do not have insurance, but you will need to ask so you aren't surprised when you get to your appointment.  2) Contact Your Local Health Department Not all health departments have doctors that can see patients for sick visits, but many do, so it is worth a call to see if yours does. If you don't know where your local health department is, you can check in your phone book. The CDC  also has a tool to help you locate your state's health department, and many state websites also have listings of all of their local health departments.  3) Find a Walk-in Clinic If your illness is not likely to be very severe or complicated, you may want to try a walk in clinic. These are popping up all over the country in pharmacies, drugstores, and shopping centers. They're usually staffed by nurse practitioners or physician assistants that have been trained to treat common illnesses and complaints.  They're usually fairly quick and inexpensive. However, if you have serious medical issues or chronic medical problems, these are probably not your best option.  No Primary Care Doctor: Call Health Connect at  [phone] - they can help you locate a primary care doctor that  accepts your insurance, provides certain services, etc. Physician Referral Service- [phone]  Chronic Pain Problems: Organization         Address  Phone   Notes  [facility]  [phone] Patients need to be referred by their primary care doctor.   Medication Assistance: Organization         Address  Phone   Notes  [facility] Medication [facility] [address] [place], [place] [address] [phone]) [phone] --Must be a resident of [facility] [place] -- Must have NO insurance coverage whatsoever (no Medicaid/ Medicare, etc.) -- The pt. MUST have a primary care doctor that directs their care regularly and follows them in the community   Med[facility]ist  [phone]   [employer]  [phone]    Agencies that provide inexpensive medical care: Organization         Address  Phone   Notes  [facility]  [phone]   [facility]    [phone]   [facility] [address] [place], [place] [address] [phone]   [facility] [address], [place] [phone]) [phone]   [facility]    [phone]) [phone]   [facility]    [phone]   [facility]  [address], [place] Phone:  [phone], Fax:  [phone] Hours of Operation:  9 am - 6 pm, M-F.  Also accepts Medicaid/Medicare and self-pay.  [facility]  [address], [place] Phone: [phone], Fax: [phone]. Hours of Operation:  8:30 am - 5:30 pm, M-F.  Also accepts Medicaid and self-pay.  [facility] [place] [address], [place]  Phone: [phone]) [phone]   [facility] [address], [place], [place] [phone], Ext. [phone] Mondays & Thursdays: 7-9 AM.  First 15 patients are seen on a first come, first serve basis.    Medicaid-accepting [facility] Providers:  Organization         Address  Phone   Notes  [facility] [address], [place] [phone] Also accepts self-pay patients.  [facility] [address], [place]  [phone]) [phone]   [facility] [address], [place] [phone]   [facility] [address] [place] [address], [place] [phone]   Renaye RakersVeita Bland [address], [place]   [phone] Only accepts [place] patients after they have their name applied to their card.   Self-Pay (no insurance) in [place]:  Organization         Address  Phone   Notes  Sickle Cell Patients, [facility] [address] [place], [place] [phone]   [facility] [address] [place], [place] [phone]   [facility]  [address], [place] [phone]   [facility]/Dr. Osei-Bonsu  [address], [place] or [address], [place] [phone] Phone number for both [place] and [place] locations is the same.  [facility] [address], [place] [phone]   [facility] [address] [place] or [address] [phone]   [facility] [address] [place] [phone], phone; [phone], fax Sees patients 1st and 3rd Saturday of every month.  Must not qualify for public or private insurance (i.e. Medicaid, Medicare, [place] [employer] Choice, Veterans' Benefits)  Household income should be no more than 200% of the poverty level The clinic cannot  treat you if you are pregnant or think you are pregnant  Sexually transmitted diseases are not treated at the clinic.

## 2018-05-05 NOTE — ED Notes (Signed)
Pt states nausea resolving

## 2018-05-06 LAB — GC/CHLAMYDIA PROBE AMP (~~LOC~~) NOT AT ARMC
Chlamydia: NEGATIVE
Neisseria Gonorrhea: NEGATIVE

## 2018-12-11 ENCOUNTER — Ambulatory Visit (INDEPENDENT_AMBULATORY_CARE_PROVIDER_SITE_OTHER): Payer: BC Managed Care – PPO

## 2018-12-11 ENCOUNTER — Ambulatory Visit (INDEPENDENT_AMBULATORY_CARE_PROVIDER_SITE_OTHER): Payer: BC Managed Care – PPO | Admitting: Podiatry

## 2018-12-11 ENCOUNTER — Encounter: Payer: Self-pay | Admitting: Podiatry

## 2018-12-11 ENCOUNTER — Other Ambulatory Visit: Payer: Self-pay

## 2018-12-11 ENCOUNTER — Other Ambulatory Visit: Payer: Self-pay | Admitting: Podiatry

## 2018-12-11 VITALS — BP 122/77 | HR 72 | Temp 98.2°F | Resp 16

## 2018-12-11 DIAGNOSIS — M25572 Pain in left ankle and joints of left foot: Secondary | ICD-10-CM | POA: Diagnosis not present

## 2018-12-11 DIAGNOSIS — M7672 Peroneal tendinitis, left leg: Secondary | ICD-10-CM

## 2018-12-11 MED ORDER — DICLOFENAC SODIUM 75 MG PO TBEC: 75.0000 mg | DELAYED_RELEASE_TABLET | Freq: Two times a day (BID) | ORAL | 2 refills | Status: DC
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
## 2018-12-11 NOTE — Progress Notes (Signed)
   Subjective:    Patient ID: Meghan Edwards, female    DOB: [birth year], 28 y.o.   MRN: [id number]  HPI    Review of Systems  All other systems reviewed and are negative.      Objective:   Physical Exam        Assessment & Plan:

## 2018-12-12 ENCOUNTER — Other Ambulatory Visit: Payer: Self-pay

## 2018-12-12 ENCOUNTER — Emergency Department (HOSPITAL_COMMUNITY)
Admission: EM | Admit: 2018-12-12 | Discharge: 2018-12-12 | Disposition: A | Discharge status: Home / Self Care | Payer: BC Managed Care – PPO | Attending: Emergency Medicine | Admitting: Emergency Medicine

## 2018-12-12 ENCOUNTER — Encounter (HOSPITAL_COMMUNITY): Payer: Self-pay | Admitting: Obstetrics and Gynecology

## 2018-12-12 DIAGNOSIS — W228XXA Striking against or struck by other objects, initial encounter: Secondary | ICD-10-CM | POA: Diagnosis not present

## 2018-12-12 DIAGNOSIS — S9002XD Contusion of left ankle, subsequent encounter: Secondary | ICD-10-CM | POA: Insufficient documentation

## 2018-12-12 DIAGNOSIS — Y999 Unspecified external cause status: Secondary | ICD-10-CM | POA: Insufficient documentation

## 2018-12-12 DIAGNOSIS — S9002XA Contusion of left ankle, initial encounter: Secondary | ICD-10-CM

## 2018-12-12 DIAGNOSIS — Y92512 Supermarket, store or market as the place of occurrence of the external cause: Secondary | ICD-10-CM | POA: Diagnosis not present

## 2018-12-12 DIAGNOSIS — F17200 Nicotine dependence, unspecified, uncomplicated: Secondary | ICD-10-CM | POA: Insufficient documentation

## 2018-12-12 DIAGNOSIS — Y9389 Activity, other specified: Secondary | ICD-10-CM | POA: Insufficient documentation

## 2018-12-12 MED ORDER — DICLOFENAC SODIUM 1 % TD GEL: 2.0000 g | Freq: Four times a day (QID) | TRANSDERMAL | 0 refills | Status: DC
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
## 2018-12-12 NOTE — ED Notes (Signed)
ED Provider at bedside. 
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
## 2018-12-15 ENCOUNTER — Telehealth: Payer: Self-pay | Admitting: Podiatry

## 2018-12-15 ENCOUNTER — Encounter: Payer: Self-pay | Admitting: *Deleted
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
## 2018-12-15 NOTE — Telephone Encounter (Signed)
Patient is unable to walk for no more than 2 hours. She is having pain and she is unable to take the pain medication while working, makes her sleepy. Please call patient

## 2018-12-15 NOTE — Telephone Encounter (Signed)
Pt called states fax letter to [facility] [phone] for dates 12/15/2018 - 12/26/2018. Letter faxed to [facility].

## 2018-12-26 ENCOUNTER — Other Ambulatory Visit: Payer: Self-pay

## 2018-12-26 ENCOUNTER — Ambulatory Visit (INDEPENDENT_AMBULATORY_CARE_PROVIDER_SITE_OTHER): Payer: BC Managed Care – PPO | Admitting: Podiatry

## 2018-12-26 ENCOUNTER — Encounter: Payer: Self-pay | Admitting: Podiatry

## 2018-12-26 VITALS — Temp 98.2°F

## 2018-12-26 DIAGNOSIS — M7672 Peroneal tendinitis, left leg: Secondary | ICD-10-CM | POA: Diagnosis not present

## 2018-12-26 DIAGNOSIS — M25572 Pain in left ankle and joints of left foot: Secondary | ICD-10-CM | POA: Diagnosis not present

## 2019-01-01 NOTE — Progress Notes (Signed)
Subjective:   Patient ID: Meghan Edwards, female   DOB: 28 y.o.   MRN: [id number]   HPI Patient had experienced quite a increase in pain in the left heel and now that has gotten somewhat better and she is walking with a better heel toe gait pattern but did state the foot felt numb but she had been concerned about that.  Patient states at this point she is improved   ROS      Objective:  Physical Exam  Neurovascular status intact muscle strength found to be adequate with patient found to have inflammation pain of the plantar lateral foot that is improved with pain still present upon deep palpation but overall quite a bit better than it was     Assessment:  Improvement of tendinitis-like symptomatology left with obesity as complicating factor to her condition     Plan:  H&P spent a great deal time educating her on this and discussed the condition and treatment options.  Due to longstanding nature I do think that we are showing improvement but I am still putting this on a watch and I advised on physical therapy I educated her today on different shoe options and she will be seen back if symptoms were to worsen

## 2019-02-12 ENCOUNTER — Ambulatory Visit (INDEPENDENT_AMBULATORY_CARE_PROVIDER_SITE_OTHER): Payer: BC Managed Care – PPO | Admitting: Podiatry

## 2019-02-12 ENCOUNTER — Encounter: Payer: Self-pay | Admitting: Podiatry

## 2019-02-12 ENCOUNTER — Other Ambulatory Visit: Payer: Self-pay

## 2019-02-12 DIAGNOSIS — T148XXA Other injury of unspecified body region, initial encounter: Secondary | ICD-10-CM | POA: Diagnosis not present

## 2019-02-12 DIAGNOSIS — M7672 Peroneal tendinitis, left leg: Secondary | ICD-10-CM | POA: Diagnosis not present

## 2019-02-13 ENCOUNTER — Telehealth: Payer: Self-pay | Admitting: *Deleted

## 2019-02-13 DIAGNOSIS — T148XXA Other injury of unspecified body region, initial encounter: Secondary | ICD-10-CM

## 2019-02-13 DIAGNOSIS — M25572 Pain in left ankle and joints of left foot: Secondary | ICD-10-CM

## 2019-02-13 DIAGNOSIS — M7672 Peroneal tendinitis, left leg: Secondary | ICD-10-CM

## 2019-02-13 NOTE — Telephone Encounter (Signed)
Orders to J. Quintana, RN for pre-cert, faxed to [facility]. 

## 2019-02-13 NOTE — Progress Notes (Signed)
Subjective:   Patient ID: Meghan Edwards, female   DOB: 28 y.o.   MRN: [id number]   HPI Patient states she simply cannot bear weight on her left foot and the outside is very tender in the boot is no longer helping her and the previous medication is not effective   ROS      Objective:  Physical Exam  Neurovascular status intact with swelling which is mostly around the peroneal tendon group as it comes on to the lateral malleolus left with fluid buildup and inflammation associated with it with extreme obesity is complicating factor     Assessment:  Possibility for peroneal tendon interstitial tear or other tendinosis for other pathological condition     Plan:  Due to failure to respond conservatively to conservative treatment and immobilization I have recommended MRI to try to ascertain as to why she is in so much pain and to rule out tear.  Patient is referred for MRI of this problem

## 2019-02-17 ENCOUNTER — Telehealth: Payer: Self-pay | Admitting: *Deleted

## 2019-02-17 NOTE — Telephone Encounter (Signed)
Pt left name and phone number. 

## 2019-02-17 NOTE — Telephone Encounter (Signed)
Pt called asked if her MRI had been scheduled. I told pt the MRI had been ordered and we were waiting for the insurance to be pre-certed for the MRI, she could call and schedule with [facility] [phone] 3-5 business days from 02/13/2019 when ordered. Pt states understanding and that her ankle hurts so bad she can't work on it and the diclofenac makes her sleep and wanted to know if she could be out of work. I told pt I would ask Dr. Paulla Dolly if she could be out of work until 02/23/2019, and she could pick up the note today.

## 2019-03-06 ENCOUNTER — Other Ambulatory Visit: Payer: BC Managed Care – PPO

## 2019-03-12 ENCOUNTER — Ambulatory Visit: Payer: BC Managed Care – PPO | Admitting: Podiatry

## 2019-07-19 ENCOUNTER — Emergency Department (HOSPITAL_BASED_OUTPATIENT_CLINIC_OR_DEPARTMENT_OTHER)
Admission: EM | Admit: 2019-07-19 | Discharge: 2019-07-19 | Disposition: A | Discharge status: Home / Self Care | Payer: BC Managed Care – PPO | Attending: Emergency Medicine | Admitting: Emergency Medicine

## 2019-07-19 ENCOUNTER — Other Ambulatory Visit: Payer: Self-pay

## 2019-07-19 ENCOUNTER — Encounter (HOSPITAL_BASED_OUTPATIENT_CLINIC_OR_DEPARTMENT_OTHER): Payer: Self-pay | Admitting: *Deleted

## 2019-07-19 ENCOUNTER — Emergency Department (HOSPITAL_BASED_OUTPATIENT_CLINIC_OR_DEPARTMENT_OTHER): Payer: BC Managed Care – PPO

## 2019-07-19 DIAGNOSIS — M25532 Pain in left wrist: Secondary | ICD-10-CM | POA: Diagnosis not present

## 2019-07-19 DIAGNOSIS — S0003XA Contusion of scalp, initial encounter: Secondary | ICD-10-CM | POA: Diagnosis not present

## 2019-07-19 DIAGNOSIS — T23172A Burn of first degree of left wrist, initial encounter: Secondary | ICD-10-CM | POA: Insufficient documentation

## 2019-07-19 DIAGNOSIS — Z79899 Other long term (current) drug therapy: Secondary | ICD-10-CM | POA: Insufficient documentation

## 2019-07-19 DIAGNOSIS — Y9389 Activity, other specified: Secondary | ICD-10-CM | POA: Diagnosis not present

## 2019-07-19 DIAGNOSIS — S0990XA Unspecified injury of head, initial encounter: Secondary | ICD-10-CM | POA: Diagnosis present

## 2019-07-19 DIAGNOSIS — Y92410 Unspecified street and highway as the place of occurrence of the external cause: Secondary | ICD-10-CM | POA: Insufficient documentation

## 2019-07-19 DIAGNOSIS — Y999 Unspecified external cause status: Secondary | ICD-10-CM | POA: Insufficient documentation

## 2019-07-19 MED ORDER — ACETAMINOPHEN 500 MG PO TABS
1000.0000 mg | ORAL_TABLET | Freq: Once | ORAL | Status: AC
  Administered 2019-07-19: 1000 mg via ORAL
  Filled 2019-07-19: qty 2

## 2019-07-19 MED ORDER — LIDOCAINE HCL URETHRAL/MUCOSAL 2 % EX GEL
1.0000 "application " | Freq: Once | CUTANEOUS | Status: DC
  Filled 2019-07-19: qty 20
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
## 2019-07-22 ENCOUNTER — Emergency Department (HOSPITAL_COMMUNITY)
Admission: EM | Admit: 2019-07-22 | Discharge: 2019-07-22 | Disposition: A | Discharge status: Home / Self Care | Payer: BC Managed Care – PPO | Attending: Emergency Medicine | Admitting: Emergency Medicine

## 2019-07-22 ENCOUNTER — Encounter (HOSPITAL_COMMUNITY): Payer: Self-pay | Admitting: Emergency Medicine

## 2019-07-22 ENCOUNTER — Emergency Department (HOSPITAL_COMMUNITY): Payer: BC Managed Care – PPO

## 2019-07-22 ENCOUNTER — Other Ambulatory Visit: Payer: Self-pay

## 2019-07-22 DIAGNOSIS — S29011A Strain of muscle and tendon of front wall of thorax, initial encounter: Secondary | ICD-10-CM | POA: Insufficient documentation

## 2019-07-22 DIAGNOSIS — Y92410 Unspecified street and highway as the place of occurrence of the external cause: Secondary | ICD-10-CM | POA: Diagnosis not present

## 2019-07-22 DIAGNOSIS — S299XXA Unspecified injury of thorax, initial encounter: Secondary | ICD-10-CM | POA: Diagnosis present

## 2019-07-22 DIAGNOSIS — Y999 Unspecified external cause status: Secondary | ICD-10-CM | POA: Diagnosis not present

## 2019-07-22 DIAGNOSIS — F17210 Nicotine dependence, cigarettes, uncomplicated: Secondary | ICD-10-CM | POA: Diagnosis not present

## 2019-07-22 DIAGNOSIS — Y939 Activity, unspecified: Secondary | ICD-10-CM | POA: Insufficient documentation

## 2019-07-22 MED ORDER — CYCLOBENZAPRINE HCL 10 MG PO TABS: 10.0000 mg | ORAL_TABLET | Freq: Every evening | ORAL | 0 refills | Status: DC | PRN

## 2019-07-22 NOTE — Discharge Instructions (Signed)
Continue to take the Tylenol and ibuprofen as needed for the pain.  You can take the muscle relaxer at night to hopefully help with some of the morning stiffness and pain.  Avoid lifting for the next 10 days to allow time to heal.
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
## 2022-01-14 ENCOUNTER — Other Ambulatory Visit: Payer: Self-pay

## 2022-01-14 ENCOUNTER — Emergency Department (HOSPITAL_COMMUNITY)
Admission: EM | Admit: 2022-01-14 | Discharge: 2022-01-14 | Discharge status: Against Medical Advice | Payer: BC Managed Care – PPO | Attending: Emergency Medicine | Admitting: Emergency Medicine

## 2022-01-14 ENCOUNTER — Encounter (HOSPITAL_COMMUNITY): Payer: Self-pay

## 2022-01-14 DIAGNOSIS — T7803XA Anaphylactic reaction due to other fish, initial encounter: Secondary | ICD-10-CM | POA: Insufficient documentation

## 2022-01-14 DIAGNOSIS — Z5321 Procedure and treatment not carried out due to patient leaving prior to being seen by health care provider: Secondary | ICD-10-CM | POA: Insufficient documentation

## 2022-01-14 NOTE — ED Triage Notes (Signed)
Patient said she was de-veining shrimp and took her shirt and rubbed her eye. Then she said her right eye began itching, becoming red, swelling. She took benadryl. Also stated her vision is becoming blurry in her right eye.

## 2022-01-14 NOTE — ED Notes (Signed)
Patient said she was tired of waiting and that she thinks the benadryl kicked in and helped her eye swelling and itching.

## 2022-04-24 ENCOUNTER — Emergency Department (HOSPITAL_COMMUNITY)
Admission: EM | Admit: 2022-04-24 | Discharge: 2022-04-25 | Discharge status: Against Medical Advice | Payer: Self-pay | Attending: Medical | Admitting: Medical

## 2022-04-24 ENCOUNTER — Encounter (HOSPITAL_COMMUNITY): Payer: Self-pay

## 2022-04-24 ENCOUNTER — Other Ambulatory Visit: Payer: Self-pay

## 2022-04-24 ENCOUNTER — Emergency Department (HOSPITAL_COMMUNITY): Payer: Self-pay

## 2022-04-24 DIAGNOSIS — R109 Unspecified abdominal pain: Secondary | ICD-10-CM | POA: Insufficient documentation

## 2022-04-24 DIAGNOSIS — R42 Dizziness and giddiness: Secondary | ICD-10-CM | POA: Insufficient documentation

## 2022-04-24 DIAGNOSIS — N921 Excessive and frequent menstruation with irregular cycle: Secondary | ICD-10-CM | POA: Insufficient documentation

## 2022-04-24 DIAGNOSIS — Z5321 Procedure and treatment not carried out due to patient leaving prior to being seen by health care provider: Secondary | ICD-10-CM | POA: Insufficient documentation

## 2022-04-24 DIAGNOSIS — N939 Abnormal uterine and vaginal bleeding, unspecified: Secondary | ICD-10-CM | POA: Diagnosis not present

## 2022-04-24 LAB — CBC WITH DIFFERENTIAL/PLATELET
Abs Immature Granulocytes: 0.01 10*3/uL (ref 0.00–0.07)
Basophils Absolute: 0 10*3/uL (ref 0.0–0.1)
Basophils Relative: 0 %
Eosinophils Absolute: 0.1 10*3/uL (ref 0.0–0.5)
Eosinophils Relative: 2 %
HCT: 36.5 % (ref 36.0–46.0)
Hemoglobin: 11.7 g/dL — ABNORMAL LOW (ref 12.0–15.0)
Immature Granulocytes: 0 %
Lymphocytes Relative: 30 %
Lymphs Abs: 1.9 10*3/uL (ref 0.7–4.0)
MCH: 25.7 pg — ABNORMAL LOW (ref 26.0–34.0)
MCHC: 32.1 g/dL (ref 30.0–36.0)
MCV: 80.2 fL (ref 80.0–100.0)
Monocytes Absolute: 0.4 10*3/uL (ref 0.1–1.0)
Monocytes Relative: 6 %
Neutro Abs: 3.9 10*3/uL (ref 1.7–7.7)
Neutrophils Relative %: 62 %
Platelets: 286 10*3/uL (ref 150–400)
RBC: 4.55 MIL/uL (ref 3.87–5.11)
RDW: 14 % (ref 11.5–15.5)
WBC: 6.4 10*3/uL (ref 4.0–10.5)
nRBC: 0 % (ref 0.0–0.2)

## 2022-04-24 LAB — COMPREHENSIVE METABOLIC PANEL
ALT: 16 U/L (ref 0–44)
AST: 21 U/L (ref 15–41)
Albumin: 3.5 g/dL (ref 3.5–5.0)
Alkaline Phosphatase: 57 U/L (ref 38–126)
Anion gap: 13 (ref 5–15)
BUN: 10 mg/dL (ref 6–20)
CO2: 22 mmol/L (ref 22–32)
Calcium: 8.6 mg/dL — ABNORMAL LOW (ref 8.9–10.3)
Chloride: 104 mmol/L (ref 98–111)
Creatinine, Ser: 0.74 mg/dL (ref 0.44–1.00)
GFR, Estimated: >60 mL/min (ref >60)
Glucose, Bld: 129 mg/dL — ABNORMAL HIGH (ref 70–99)
Potassium: 3.3 mmol/L — ABNORMAL LOW (ref 3.5–5.1)
Sodium: 139 mmol/L (ref 135–145)
Total Bilirubin: 0.3 mg/dL (ref 0.3–1.2)
Total Protein: 6.6 g/dL (ref 6.5–8.1)

## 2022-04-24 LAB — I-STAT CHEM 8, ED
BUN: 9 mg/dL (ref 6–20)
Calcium, Ion: 1.09 mmol/L — ABNORMAL LOW (ref 1.15–1.40)
Chloride: 104 mmol/L (ref 98–111)
Creatinine, Ser: 0.8 mg/dL (ref 0.44–1.00)
Glucose, Bld: 129 mg/dL — ABNORMAL HIGH (ref 70–99)
HCT: 37 % (ref 36.0–46.0)
Hemoglobin: 12.6 g/dL (ref 12.0–15.0)
Potassium: 3.3 mmol/L — ABNORMAL LOW (ref 3.5–5.1)
Sodium: 140 mmol/L (ref 135–145)
TCO2: 22 mmol/L (ref 22–32)

## 2022-04-24 LAB — PROTIME-INR
INR: 1 (ref 0.8–1.2)
Prothrombin Time: 13.4 seconds (ref 11.4–15.2)

## 2022-04-24 LAB — I-STAT BETA HCG BLOOD, ED (MC, WL, AP ONLY): I-stat hCG, quantitative: <5 m[IU]/mL (ref <5)

## 2022-04-24 LAB — ABO/RH: ABO/RH(D): O POS

## 2022-04-24 LAB — HCG, QUANTITATIVE, PREGNANCY: hCG, Beta Chain, Quant, S: <1 m[IU]/mL (ref <5)

## 2022-04-24 NOTE — ED Triage Notes (Signed)
Arrives POV with c/o heavy vaginal bleeding x 1-2 months.   Reports 17 pads saturated today.   Drowsy.

## 2022-04-24 NOTE — ED Provider Triage Note (Signed)
Emergency Medicine Provider Triage Evaluation Note  Meghan Edwards , a 31 y.o. female  was evaluated in triage.  Pt complains of heavy menstrual bleeding x1 month. Reports passing large clots and going through 17 pads/day. Reports lightheadedness and abdominal cramping.  Review of Systems  Positive: Vaginal bleeding, abdominal pain Negative: N/V  Physical Exam  BP 119/77 (BP Location: Right Arm)   Pulse 74   Temp 98.4 F (36.9 C) (Oral)   Resp 18   SpO2 94%  Gen:   Awake, no distress   Resp:  Normal effort  MSK:   Moves extremities without difficulty   Medical Decision Making  Medically screening exam initiated at 9:46 PM.  Appropriate orders placed.  Meghan Edwards was informed that the remainder of the evaluation will be completed by another provider, this initial triage assessment does not replace that evaluation, and the importance of remaining in the ED until their evaluation is complete.     Pete Pelt, [place] 04/24/22 2147

## 2022-04-25 ENCOUNTER — Emergency Department (HOSPITAL_COMMUNITY)
Admission: EM | Admit: 2022-04-25 | Discharge: 2022-04-25 | Discharge status: Against Medical Advice | Payer: Self-pay | Attending: Emergency Medicine | Admitting: Emergency Medicine

## 2022-04-25 DIAGNOSIS — N939 Abnormal uterine and vaginal bleeding, unspecified: Secondary | ICD-10-CM | POA: Diagnosis not present

## 2022-04-25 DIAGNOSIS — Z5321 Procedure and treatment not carried out due to patient leaving prior to being seen by health care provider: Secondary | ICD-10-CM | POA: Insufficient documentation

## 2022-04-25 LAB — COMPREHENSIVE METABOLIC PANEL
ALT: 14 U/L (ref 0–44)
AST: 16 U/L (ref 15–41)
Albumin: 3.5 g/dL (ref 3.5–5.0)
Alkaline Phosphatase: 39 U/L (ref 38–126)
Anion gap: 6 (ref 5–15)
BUN: 8 mg/dL (ref 6–20)
CO2: 25 mmol/L (ref 22–32)
Calcium: 8.1 mg/dL — ABNORMAL LOW (ref 8.9–10.3)
Chloride: 107 mmol/L (ref 98–111)
Creatinine, Ser: 0.75 mg/dL (ref 0.44–1.00)
GFR, Estimated: >60 mL/min (ref >60)
Glucose, Bld: 82 mg/dL (ref 70–99)
Potassium: 3.5 mmol/L (ref 3.5–5.1)
Sodium: 138 mmol/L (ref 135–145)
Total Bilirubin: 0.4 mg/dL (ref 0.3–1.2)
Total Protein: 6.4 g/dL — ABNORMAL LOW (ref 6.5–8.1)

## 2022-04-25 LAB — CBC WITH DIFFERENTIAL/PLATELET
Abs Immature Granulocytes: 0 10*3/uL (ref 0.00–0.07)
Basophils Absolute: 0 10*3/uL (ref 0.0–0.1)
Basophils Relative: 0 %
Eosinophils Absolute: 0.1 10*3/uL (ref 0.0–0.5)
Eosinophils Relative: 2 %
HCT: 32.9 % — ABNORMAL LOW (ref 36.0–46.0)
Hemoglobin: 10.7 g/dL — ABNORMAL LOW (ref 12.0–15.0)
Immature Granulocytes: 0 %
Lymphocytes Relative: 41 %
Lymphs Abs: 2 10*3/uL (ref 0.7–4.0)
MCH: 25.9 pg — ABNORMAL LOW (ref 26.0–34.0)
MCHC: 32.5 g/dL (ref 30.0–36.0)
MCV: 79.7 fL — ABNORMAL LOW (ref 80.0–100.0)
Monocytes Absolute: 0.4 10*3/uL (ref 0.1–1.0)
Monocytes Relative: 8 %
Neutro Abs: 2.4 10*3/uL (ref 1.7–7.7)
Neutrophils Relative %: 49 %
Platelets: 250 10*3/uL (ref 150–400)
RBC: 4.13 MIL/uL (ref 3.87–5.11)
RDW: 14 % (ref 11.5–15.5)
WBC: 4.8 10*3/uL (ref 4.0–10.5)
nRBC: 0 % (ref 0.0–0.2)

## 2022-04-25 LAB — HCG, QUANTITATIVE, PREGNANCY: hCG, Beta Chain, Quant, S: <1 m[IU]/mL (ref <5)

## 2022-04-25 NOTE — ED Notes (Signed)
Unable to find pt in waiting room. 

## 2022-04-25 NOTE — ED Triage Notes (Signed)
Pt reports constant vaginal bleeding x1.5 months with about 17 pads/day.  Pt reports abd pain with nausea, dizziness, headache.  [facility].

## 2022-04-25 NOTE — ED Provider Triage Note (Signed)
Emergency Medicine Provider Triage Evaluation Note  Meghan Edwards , a 31 y.o. female  was evaluated in triage.  Pt complains of vaginal  bleeding for 1.5 months. It is now to the point where she is having large blood clots and feeling weak and dizzy.Marland Kitchen She was seen yesterday and had labs and Pelvic US. She left prior to being seen. She said she has had heavy vaginal bleeding in the past and thinks that they gave her medication to make it stop  Review of Systems  Positive:  Negative:   Physical Exam  BP 139/76 (BP Location: Left Arm)   Pulse 64   Temp 98.1 F (36.7 C) (Oral)   Resp 18   LMP  (LMP Unknown)   SpO2 99%  Gen:   Awake, no distress   Resp:  Normal effort  MSK:   Moves extremities without difficulty  Other:    Medical Decision Making  Medically screening exam initiated at 12:08 PM.  Appropriate orders placed.  Meghan Edwards was informed that the remainder of the evaluation will be completed by another provider, this initial triage assessment does not replace that evaluation, and the importance of remaining in the ED until their evaluation is complete.     Meghan Edwards, [place] 04/25/22 1209

## 2022-06-21 DIAGNOSIS — Z202 Contact with and (suspected) exposure to infections with a predominantly sexual mode of transmission: Secondary | ICD-10-CM | POA: Diagnosis not present

## 2022-07-20 DIAGNOSIS — F411 Generalized anxiety disorder: Secondary | ICD-10-CM | POA: Diagnosis not present

## 2022-07-20 DIAGNOSIS — Z0000 Encounter for general adult medical examination without abnormal findings: Secondary | ICD-10-CM | POA: Diagnosis not present

## 2022-07-20 DIAGNOSIS — Z862 Personal history of diseases of the blood and blood-forming organs and certain disorders involving the immune mechanism: Secondary | ICD-10-CM | POA: Diagnosis not present

## 2022-07-20 DIAGNOSIS — Z13220 Encounter for screening for lipoid disorders: Secondary | ICD-10-CM | POA: Diagnosis not present

## 2022-07-20 DIAGNOSIS — Z113 Encounter for screening for infections with a predominantly sexual mode of transmission: Secondary | ICD-10-CM | POA: Diagnosis not present

## 2022-07-20 DIAGNOSIS — N926 Irregular menstruation, unspecified: Secondary | ICD-10-CM | POA: Diagnosis not present

## 2022-07-20 DIAGNOSIS — F332 Major depressive disorder, recurrent severe without psychotic features: Secondary | ICD-10-CM | POA: Diagnosis not present

## 2022-07-20 DIAGNOSIS — F4310 Post-traumatic stress disorder, unspecified: Secondary | ICD-10-CM | POA: Diagnosis not present

## 2022-08-08 DIAGNOSIS — N912 Amenorrhea, unspecified: Secondary | ICD-10-CM | POA: Diagnosis not present

## 2022-08-08 DIAGNOSIS — Z01419 Encounter for gynecological examination (general) (routine) without abnormal findings: Secondary | ICD-10-CM | POA: Diagnosis not present

## 2022-08-08 DIAGNOSIS — Z124 Encounter for screening for malignant neoplasm of cervix: Secondary | ICD-10-CM | POA: Diagnosis not present

## 2022-08-08 DIAGNOSIS — Z1151 Encounter for screening for human papillomavirus (HPV): Secondary | ICD-10-CM | POA: Diagnosis not present

## 2022-08-08 DIAGNOSIS — N946 Dysmenorrhea, unspecified: Secondary | ICD-10-CM | POA: Diagnosis not present

## 2022-08-09 DIAGNOSIS — Z113 Encounter for screening for infections with a predominantly sexual mode of transmission: Secondary | ICD-10-CM | POA: Diagnosis not present

## 2022-08-29 DIAGNOSIS — R9389 Abnormal findings on diagnostic imaging of other specified body structures: Secondary | ICD-10-CM | POA: Diagnosis not present

## 2022-08-29 DIAGNOSIS — L680 Hirsutism: Secondary | ICD-10-CM | POA: Diagnosis not present

## 2022-08-29 DIAGNOSIS — N912 Amenorrhea, unspecified: Secondary | ICD-10-CM | POA: Diagnosis not present

## 2022-08-29 DIAGNOSIS — N946 Dysmenorrhea, unspecified: Secondary | ICD-10-CM | POA: Diagnosis not present

## 2022-08-29 DIAGNOSIS — R7303 Prediabetes: Secondary | ICD-10-CM | POA: Diagnosis not present

## 2022-11-28 DIAGNOSIS — N926 Irregular menstruation, unspecified: Secondary | ICD-10-CM | POA: Diagnosis not present

## 2022-11-28 DIAGNOSIS — N898 Other specified noninflammatory disorders of vagina: Secondary | ICD-10-CM | POA: Diagnosis not present

## 2022-11-28 DIAGNOSIS — Z113 Encounter for screening for infections with a predominantly sexual mode of transmission: Secondary | ICD-10-CM | POA: Diagnosis not present

## 2022-12-21 ENCOUNTER — Emergency Department (HOSPITAL_COMMUNITY)
Admission: EM | Admit: 2022-12-21 | Discharge: 2022-12-21 | Disposition: A | Discharge status: Home / Self Care | Payer: Self-pay | Attending: Emergency Medicine | Admitting: Emergency Medicine

## 2022-12-21 ENCOUNTER — Other Ambulatory Visit: Payer: Self-pay

## 2022-12-21 DIAGNOSIS — L03115 Cellulitis of right lower limb: Secondary | ICD-10-CM | POA: Insufficient documentation

## 2022-12-21 LAB — CBC WITH DIFFERENTIAL/PLATELET
Abs Immature Granulocytes: 0.02 10*3/uL (ref 0.00–0.07)
Basophils Absolute: 0 10*3/uL (ref 0.0–0.1)
Basophils Relative: 0 %
Eosinophils Absolute: 0.2 10*3/uL (ref 0.0–0.5)
Eosinophils Relative: 3 %
HCT: 35.1 % — ABNORMAL LOW (ref 36.0–46.0)
Hemoglobin: 11.1 g/dL — ABNORMAL LOW (ref 12.0–15.0)
Immature Granulocytes: 0 %
Lymphocytes Relative: 34 %
Lymphs Abs: 2.2 10*3/uL (ref 0.7–4.0)
MCH: 24.9 pg — ABNORMAL LOW (ref 26.0–34.0)
MCHC: 31.6 g/dL (ref 30.0–36.0)
MCV: 78.7 fL — ABNORMAL LOW (ref 80.0–100.0)
Monocytes Absolute: 0.4 10*3/uL (ref 0.1–1.0)
Monocytes Relative: 6 %
Neutro Abs: 3.7 10*3/uL (ref 1.7–7.7)
Neutrophils Relative %: 57 %
Platelets: 261 10*3/uL (ref 150–400)
RBC: 4.46 MIL/uL (ref 3.87–5.11)
RDW: 14.6 % (ref 11.5–15.5)
WBC: 6.6 10*3/uL (ref 4.0–10.5)
nRBC: 0 % (ref 0.0–0.2)

## 2022-12-21 LAB — BASIC METABOLIC PANEL
Anion gap: 8 (ref 5–15)
BUN: 8 mg/dL (ref 6–20)
CO2: 25 mmol/L (ref 22–32)
Calcium: 8.7 mg/dL — ABNORMAL LOW (ref 8.9–10.3)
Chloride: 106 mmol/L (ref 98–111)
Creatinine, Ser: 0.89 mg/dL (ref 0.44–1.00)
GFR, Estimated: >60 mL/min (ref >60)
Glucose, Bld: 90 mg/dL (ref 70–99)
Potassium: 3.9 mmol/L (ref 3.5–5.1)
Sodium: 139 mmol/L (ref 135–145)

## 2022-12-21 LAB — HCG, SERUM, QUALITATIVE: Preg, Serum: NEGATIVE

## 2022-12-21 MED ORDER — CEPHALEXIN 250 MG PO CAPS
500.0000 mg | ORAL_CAPSULE | Freq: Once | ORAL | Status: AC
  Administered 2022-12-21: 500 mg via ORAL
  Filled 2022-12-21: qty 2

## 2022-12-21 MED ORDER — CEPHALEXIN 500 MG PO CAPS: 500.0000 mg | ORAL_CAPSULE | Freq: Three times a day (TID) | ORAL | 0 refills | Status: AC

## 2022-12-21 NOTE — ED Triage Notes (Signed)
Patient reports infected tattoo at right calf with swelling and drainage onset this week , no fever or chills .
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
## 2023-01-21 DIAGNOSIS — Z113 Encounter for screening for infections with a predominantly sexual mode of transmission: Secondary | ICD-10-CM | POA: Diagnosis not present

## 2023-02-10 ENCOUNTER — Emergency Department (HOSPITAL_COMMUNITY)
Admission: EM | Admit: 2023-02-10 | Discharge: 2023-02-11 | Disposition: A | Discharge status: Other Acute Inpatient Hospital | Payer: BC Managed Care – PPO | Attending: Emergency Medicine | Admitting: Emergency Medicine

## 2023-02-10 DIAGNOSIS — F32A Depression, unspecified: Secondary | ICD-10-CM | POA: Insufficient documentation

## 2023-02-10 DIAGNOSIS — R45851 Suicidal ideations: Secondary | ICD-10-CM | POA: Diagnosis not present

## 2023-02-11 ENCOUNTER — Other Ambulatory Visit: Payer: Self-pay

## 2023-02-11 ENCOUNTER — Encounter (HOSPITAL_COMMUNITY): Payer: Self-pay | Admitting: Nurse Practitioner

## 2023-02-11 ENCOUNTER — Inpatient Hospital Stay (HOSPITAL_COMMUNITY)
Admission: AD | Admit: 2023-02-11 | Discharge: 2023-02-14 | DRG: 882 | Disposition: A | Discharge status: Home / Self Care | Payer: No Typology Code available for payment source | Source: Intra-hospital | Attending: Psychiatry | Admitting: Psychiatry

## 2023-02-11 DIAGNOSIS — Z8249 Family history of ischemic heart disease and other diseases of the circulatory system: Secondary | ICD-10-CM

## 2023-02-11 DIAGNOSIS — Z823 Family history of stroke: Secondary | ICD-10-CM | POA: Diagnosis not present

## 2023-02-11 DIAGNOSIS — D500 Iron deficiency anemia secondary to blood loss (chronic): Secondary | ICD-10-CM | POA: Diagnosis present

## 2023-02-11 DIAGNOSIS — F4310 Post-traumatic stress disorder, unspecified: Secondary | ICD-10-CM | POA: Diagnosis present

## 2023-02-11 DIAGNOSIS — R4587 Impulsiveness: Secondary | ICD-10-CM | POA: Diagnosis present

## 2023-02-11 DIAGNOSIS — R45851 Suicidal ideations: Secondary | ICD-10-CM | POA: Diagnosis present

## 2023-02-11 DIAGNOSIS — Z888 Allergy status to other drugs, medicaments and biological substances status: Secondary | ICD-10-CM

## 2023-02-11 DIAGNOSIS — Z9151 Personal history of suicidal behavior: Secondary | ICD-10-CM

## 2023-02-11 DIAGNOSIS — F322 Major depressive disorder, single episode, severe without psychotic features: Secondary | ICD-10-CM | POA: Diagnosis present

## 2023-02-11 DIAGNOSIS — Z639 Problem related to primary support group, unspecified: Secondary | ICD-10-CM

## 2023-02-11 DIAGNOSIS — Z833 Family history of diabetes mellitus: Secondary | ICD-10-CM

## 2023-02-11 DIAGNOSIS — Z885 Allergy status to narcotic agent status: Secondary | ICD-10-CM | POA: Diagnosis not present

## 2023-02-11 DIAGNOSIS — F4323 Adjustment disorder with mixed anxiety and depressed mood: Secondary | ICD-10-CM | POA: Diagnosis present

## 2023-02-11 DIAGNOSIS — F32A Depression, unspecified: Secondary | ICD-10-CM | POA: Diagnosis not present

## 2023-02-11 LAB — CBC WITH DIFFERENTIAL/PLATELET
Abs Immature Granulocytes: 0.01 10*3/uL (ref 0.00–0.07)
Basophils Absolute: 0 10*3/uL (ref 0.0–0.1)
Basophils Relative: 0 %
Eosinophils Absolute: 0.1 10*3/uL (ref 0.0–0.5)
Eosinophils Relative: 1 %
HCT: 41.7 % (ref 36.0–46.0)
Hemoglobin: 13.3 g/dL (ref 12.0–15.0)
Immature Granulocytes: 0 %
Lymphocytes Relative: 34 %
Lymphs Abs: 2.2 10*3/uL (ref 0.7–4.0)
MCH: 25 pg — ABNORMAL LOW (ref 26.0–34.0)
MCHC: 31.9 g/dL (ref 30.0–36.0)
MCV: 78.5 fL — ABNORMAL LOW (ref 80.0–100.0)
Monocytes Absolute: 0.4 10*3/uL (ref 0.1–1.0)
Monocytes Relative: 6 %
Neutro Abs: 3.8 10*3/uL (ref 1.7–7.7)
Neutrophils Relative %: 59 %
Platelets: 307 10*3/uL (ref 150–400)
RBC: 5.31 MIL/uL — ABNORMAL HIGH (ref 3.87–5.11)
RDW: 14.7 % (ref 11.5–15.5)
WBC: 6.4 10*3/uL (ref 4.0–10.5)
nRBC: 0 % (ref 0.0–0.2)

## 2023-02-11 LAB — ETHANOL: Alcohol, Ethyl (B): <10 mg/dL (ref <10)

## 2023-02-11 LAB — RAPID URINE DRUG SCREEN, HOSP PERFORMED
Amphetamines: NOT DETECTED
Barbiturates: NOT DETECTED
Benzodiazepines: NOT DETECTED
Cocaine: NOT DETECTED
Opiates: NOT DETECTED
Tetrahydrocannabinol: POSITIVE — AB

## 2023-02-11 LAB — COMPREHENSIVE METABOLIC PANEL
ALT: 16 U/L (ref 0–44)
AST: 17 U/L (ref 15–41)
Albumin: 4.4 g/dL (ref 3.5–5.0)
Alkaline Phosphatase: 59 U/L (ref 38–126)
Anion gap: 9 (ref 5–15)
BUN: 10 mg/dL (ref 6–20)
CO2: 25 mmol/L (ref 22–32)
Calcium: 8.7 mg/dL — ABNORMAL LOW (ref 8.9–10.3)
Chloride: 102 mmol/L (ref 98–111)
Creatinine, Ser: 0.87 mg/dL (ref 0.44–1.00)
GFR, Estimated: >60 mL/min (ref >60)
Glucose, Bld: 101 mg/dL — ABNORMAL HIGH (ref 70–99)
Potassium: 3.5 mmol/L (ref 3.5–5.1)
Sodium: 136 mmol/L (ref 135–145)
Total Bilirubin: 0.3 mg/dL (ref 0.3–1.2)
Total Protein: 8.1 g/dL (ref 6.5–8.1)

## 2023-02-11 LAB — URINALYSIS, ROUTINE W REFLEX MICROSCOPIC
Bacteria, UA: NONE SEEN
Bilirubin Urine: NEGATIVE
Glucose, UA: NEGATIVE mg/dL
Ketones, ur: NEGATIVE mg/dL
Leukocytes,Ua: NEGATIVE
Nitrite: NEGATIVE
Protein, ur: 30 mg/dL — AB
Specific Gravity, Urine: 1.026 (ref 1.005–1.030)
pH: 6 (ref 5.0–8.0)

## 2023-02-11 LAB — HCG, SERUM, QUALITATIVE: Preg, Serum: NEGATIVE

## 2023-02-11 LAB — SALICYLATE LEVEL: Salicylate Lvl: <7 mg/dL — ABNORMAL LOW (ref 7.0–30.0)

## 2023-02-11 LAB — ACETAMINOPHEN LEVEL: Acetaminophen (Tylenol), Serum: <10 ug/mL — ABNORMAL LOW (ref 10–30)

## 2023-02-11 LAB — MAGNESIUM: Magnesium: 2.1 mg/dL (ref 1.7–2.4)

## 2023-02-11 MED ORDER — LORAZEPAM 1 MG PO TABS: 2.0000 mg | ORAL_TABLET | Freq: Three times a day (TID) | ORAL | Status: DC | PRN

## 2023-02-11 MED ORDER — HALOPERIDOL LACTATE 5 MG/ML IJ SOLN: 5.0000 mg | Freq: Three times a day (TID) | INTRAMUSCULAR | Status: DC | PRN

## 2023-02-11 MED ORDER — HALOPERIDOL 5 MG PO TABS: 5.0000 mg | ORAL_TABLET | Freq: Three times a day (TID) | ORAL | Status: DC | PRN

## 2023-02-11 MED ORDER — ACETAMINOPHEN 325 MG PO TABS: 650.0000 mg | ORAL_TABLET | Freq: Four times a day (QID) | ORAL | Status: DC | PRN

## 2023-02-11 MED ORDER — DIPHENHYDRAMINE HCL 50 MG/ML IJ SOLN: 50.0000 mg | Freq: Three times a day (TID) | INTRAMUSCULAR | Status: DC | PRN

## 2023-02-11 MED ORDER — ALUM & MAG HYDROXIDE-SIMETH 200-200-20 MG/5ML PO SUSP: 30.0000 mL | ORAL | Status: DC | PRN

## 2023-02-11 MED ORDER — DIPHENHYDRAMINE HCL 25 MG PO CAPS: 50.0000 mg | ORAL_CAPSULE | Freq: Three times a day (TID) | ORAL | Status: DC | PRN

## 2023-02-11 MED ORDER — LORAZEPAM 2 MG/ML IJ SOLN: 2.0000 mg | Freq: Three times a day (TID) | INTRAMUSCULAR | Status: DC | PRN

## 2023-02-11 NOTE — Progress Notes (Signed)
   02/11/23 2202  Psych Admission Type (Psych Patients Only)  Admission Status Involuntary  Psychosocial Assessment  Patient Complaints Depression  Eye Contact Fair  Facial Expression Flat  Affect Flat  Speech Logical/coherent  Interaction Assertive  Motor Activity Other (Comment) (WNL)  Appearance/Hygiene In scrubs  Behavior Characteristics Cooperative  Mood Depressed  Thought Process  Coherency WDL  Content WDL  Delusions None reported or observed  Perception WDL  Hallucination None reported or observed  Judgment Poor  Confusion None  Danger to Self  Current suicidal ideation? Denies  Agreement Not to Harm Self Yes  Description of Agreement verbal  Danger to Others  Danger to Others None reported or observed

## 2023-02-11 NOTE — [facility] Group Notes (Signed)
[facility] Group Notes:  (Nursing/MHT/Case Management/Adjunct)  Date:  02/11/2023  Time:  8:33 PM  Type of Therapy:   AA group  Participation Level:  Minimal  Participation Quality:  Appropriate  Affect:  Appropriate  Cognitive:  Appropriate  Insight:  Appropriate  Engagement in Group:  Engaged  Modes of Intervention:  Education  Summary of Progress/Problems:  Meghan Edwards 02/11/2023, 8:33 PM

## 2023-02-11 NOTE — ED Notes (Signed)
PT refuse to change out. [place] [employer] info her she will only speak to MD then leave.

## 2023-02-11 NOTE — ED Notes (Signed)
Pt had ONE belonging bag that has been moved to the cabinet for [place]

## 2023-02-11 NOTE — Progress Notes (Signed)
   02/11/23 1333  Psych Admission Type (Psych Patients Only)  Admission Status Involuntary  Psychosocial Assessment  Patient Complaints Depression  Eye Contact Fair  Facial Expression Flat  Affect Flat  Speech Logical/coherent  Interaction Assertive  Motor Activity Other (Comment) (WDL)  Appearance/Hygiene In scrubs  Behavior Characteristics Cooperative  Mood Depressed  Thought Process  Coherency WDL  Content WDL  Delusions None reported or observed  Perception WDL  Hallucination None reported or observed  Judgment Poor  Confusion None  Danger to Self  Current suicidal ideation? Denies  Agreement Not to Harm Self Yes  Description of Agreement verbal  Danger to Others  Danger to Others None reported or observed

## 2023-02-11 NOTE — ED Notes (Signed)
TTS set up at bedside. 
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
## 2023-02-11 NOTE — BH Assessment (Signed)
Clinician messaged Leana Roe, RN: "Hey. It's Trey with TTS. Is the pt able to engage in the assessment, if so the pt will need to be placed in a private room. Is the pt under IVC? Also is the pt medically cleared?"   Redmond Pulling, MS, [facility], [facility] Triage Specialist [phone]

## 2023-02-11 NOTE — ED Notes (Signed)
Pt was dressed out in gown and burgundy scrub top. Pt has 2 gold necklaces, a gold ring, a silver ring, a gold nose ring and two black gauges for her ears. Pts jewelry was placed into a labeled, clean, urine cup that was placed into her black fanny pack. ALL of the pts belongings were placed into a PT BELONGINGS bag, tied up and labeled. Pt belongings bag placed in cabinet above nurse station.

## 2023-02-11 NOTE — ED Provider Notes (Signed)
[place] EMERGENCY DEPARTMENT AT [facility] Provider Note   CSN: [id number] Arrival date & time: 02/10/23  2341     History  Chief Complaint  Patient presents with   Psychiatric Evaluation    Meghan Edwards is a 32 y.o. female.  HPI Patient presents for suicidal ideation.  Medical history includes anemia.  She arrives via EMS after threatening to jump from a bridge.  Patient denies any current physical complaints.  She states that she does not want to be here.    Home Medications Prior to Admission medications   Medication Sig Start Date End Date Taking? Authorizing Provider  acetaminophen (TYLENOL) 325 MG tablet Take 650 mg by mouth every 6 (six) hours as needed for mild pain or headache.    [provider]  APPLE CIDER VINEGAR PO Take 1 tablet by mouth daily.    [provider]  cyclobenzaprine (FLEXERIL) 10 MG tablet Take 1 tablet (10 mg total) by mouth at bedtime as needed for muscle spasms. 07/22/19   Gwyneth Sprout, MD  diclofenac (VOLTAREN) 75 MG EC tablet Take 1 tablet (75 mg total) by mouth 2 (two) times daily. Patient not taking: Reported on 07/22/2019 12/11/18   Lenn Sink, DPM  diclofenac sodium (VOLTAREN) 1 % GEL Apply 2 g topically 4 (four) times daily. Patient not taking: Reported on 07/22/2019 12/12/18   Fayrene Helper, PA-C  ibuprofen (ADVIL) 200 MG tablet Take 400 mg by mouth every 4 (four) hours as needed for fever, headache or moderate pain.    [provider]  ibuprofen (ADVIL,MOTRIN) 600 MG tablet Take 1 tablet (600 mg total) by mouth every 6 (six) hours as needed. Patient not taking: Reported on 07/22/2019 04/21/15   Rasch, Victorino Dike I, NP      Allergies    Diclofenac and Tramadol    Review of Systems   Review of Systems  Psychiatric/Behavioral:  Positive for suicidal ideas.   All other systems reviewed and are negative.   Physical Exam Updated Vital Signs BP (!) 151/87   Pulse 64   Temp 97.7 F (36.5 C)    Resp 18   SpO2 100%  Physical Exam Vitals and nursing note reviewed.  Constitutional:      General: She is not in acute distress.    Appearance: Normal appearance. She is well-developed. She is not ill-appearing, toxic-appearing or diaphoretic.  HENT:     Head: Normocephalic and atraumatic.     Right Ear: External ear normal.     Left Ear: External ear normal.     Nose: Nose normal.     Mouth/Throat:     Mouth: Mucous membranes are moist.  Eyes:     Extraocular Movements: Extraocular movements intact.     Conjunctiva/sclera: Conjunctivae normal.  Cardiovascular:     Rate and Rhythm: Normal rate and regular rhythm.  Pulmonary:     Effort: Pulmonary effort is normal. No respiratory distress.  Abdominal:     General: There is no distension.     Palpations: Abdomen is soft.  Musculoskeletal:        General: No swelling. Normal range of motion.     Cervical back: Normal range of motion and neck supple.  Skin:    General: Skin is warm and dry.     Coloration: Skin is not jaundiced or pale.  Neurological:     General: No focal deficit present.     Mental Status: She is alert and oriented to person, place, and  time.  Psychiatric:        Mood and Affect: Mood normal. Affect is blunt.        Speech: Speech normal.        Behavior: Behavior is withdrawn.        Thought Content: Thought content includes suicidal ideation. Thought content includes suicidal plan.     ED Results / Procedures / Treatments   Labs (all labs ordered are listed, but only abnormal results are displayed) Labs Reviewed  COMPREHENSIVE METABOLIC PANEL - Abnormal; Notable for the following components:      Result Value   Glucose, Bld 101 (*)    Calcium 8.7 (*)    All other components within normal limits  RAPID URINE DRUG SCREEN, HOSP PERFORMED - Abnormal; Notable for the following components:   Tetrahydrocannabinol POSITIVE (*)    All other components within normal limits  CBC WITH DIFFERENTIAL/PLATELET -  Abnormal; Notable for the following components:   RBC 5.31 (*)    MCV 78.5 (*)    MCH 25.0 (*)    All other components within normal limits  SALICYLATE LEVEL - Abnormal; Notable for the following components:   Salicylate Lvl <7.0 (*)    All other components within normal limits  ACETAMINOPHEN LEVEL - Abnormal; Notable for the following components:   Acetaminophen (Tylenol), Serum <10 (*)    All other components within normal limits  URINALYSIS, ROUTINE W REFLEX MICROSCOPIC - Abnormal; Notable for the following components:   APPearance HAZY (*)    Hgb urine dipstick LARGE (*)    Protein, ur 30 (*)    All other components within normal limits  ETHANOL  HCG, SERUM, QUALITATIVE  MAGNESIUM    EKG None  Radiology No results found.  Procedures Procedures    Medications Ordered in ED Medications - No data to display  ED Course/ Medical Decision Making/ A&P                                 Medical Decision Making Amount and/or Complexity of Data Reviewed Labs: ordered.   Patient presenting for suicidal ideation with plan to jump off bridge.  She denies any physical complaints on arrival.  Vital signs are notable for moderate hypertension.  She is well-appearing on exam.  She states that she does not wish to be here.  IVC paperwork was completed.  Medical clearance lab work was ordered.  Lab results were unremarkable.  Patient is medically cleared.  Patient to remain in the ED awaiting TTS evaluation.        Final Clinical Impression(s) / ED Diagnoses Final diagnoses:  Suicidal ideation    Rx / DC Orders ED Discharge Orders     None         Gloris Manchester, MD 02/11/23 [phone]

## 2023-02-11 NOTE — ED Provider Notes (Signed)
  Physical Exam  BP (!) 142/96 (BP Location: Right Wrist)   Pulse 64   Temp 98.1 F (36.7 C) (Oral)   Resp 18   SpO2 100%   Physical Exam  Procedures  Procedures  ED Course / MDM    Medical Decision Making Amount and/or Complexity of Data Reviewed Labs: ordered.  Risk Decision regarding hospitalization.   Patient accepted at [facility].       Benjiman Core, MD 02/11/23 [phone]

## 2023-02-11 NOTE — [facility] Group Notes (Signed)
[facility] Group Notes:  (Nursing/MHT/Case Management/Adjunct)  Date:  02/11/2023  Time:  8:23 PM  Type of Therapy:   AA group  Participation Level:  Did Not Attend  Participation Quality:    Affect:    Cognitive:    Insight:    Engagement in Group:    Modes of Intervention:    Summary of Progress/Problems:  Meghan Edwards 02/11/2023, 8:23 PM
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
## 2023-02-12 DIAGNOSIS — F4323 Adjustment disorder with mixed anxiety and depressed mood: Principal | ICD-10-CM

## 2023-02-12 MED ORDER — HYDROXYZINE HCL 25 MG PO TABS: 25.0000 mg | ORAL_TABLET | Freq: Three times a day (TID) | ORAL | Status: DC | PRN

## 2023-02-12 MED ORDER — TRAZODONE HCL 50 MG PO TABS: 50.0000 mg | ORAL_TABLET | Freq: Every evening | ORAL | Status: DC | PRN

## 2023-02-12 NOTE — Plan of Care (Signed)
  Problem: Education: Goal: Knowledge of [place] [facility] Education information/materials will improve Outcome: Progressing Goal: Emotional status will improve Outcome: Progressing Goal: Mental status will improve Outcome: Progressing   

## 2023-02-12 NOTE — H&P (Signed)
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  Haldol 5 mg TID PO PRN agitation Haldol 5 mg TID IM PRN agitation   Ativan 2 mg TID PO PRN agitation  Ativan 2 mg TID IM PRN agitation               Other PRNS:  Tylenol 650 mg Q6H PRN mild pain  Maalox/Mylanta 30 mL Q4H PRN indigestion      3. Medical Issues Being Addressed:   #Anemia Hgb WNL (13.3) on admission labs  Initiate Ferrous Sulfate 325 mg daily with breakfast per home regimen    4. Group Therapy: Encouraged patient to participate in unit milieu and in scheduled group therapies  Short Term Goals: Ability to identify  changes in lifestyle to reduce recurrence of condition will improve, Ability to verbalize feelings will improve, Ability to disclose and discuss suicidal ideas, Ability to demonstrate self-control will improve, Ability to identify and develop effective coping behaviors will improve, and Ability to identify triggers associated with substance abuse/mental health issues will improve Long Term Goals: Improvement in symptoms so as ready for discharge Patient desires to build additional coping skills.   5. Discharge Planning:  Social work and case management to assist with discharge planning and identification of hospital follow-up needs prior to discharge Estimated LOS: > 48-72 hours Discharge Concerns: Need to establish a safety plan Discharge Goals: Return home with outpatient referrals for mental health follow-up including psychotherapy.     Disposition: Patient requires continued inpatient level of psychiatric care for further assessment, monitoring, and safety planning.   I certify that inpatient services furnished can reasonably be expected to improve the patient's condition.     Total Time Spent in Direct Patient Care:  I personally spent 90 minutes on the unit in direct patient care. The direct patient care time included face-to-face time with the patient, reviewing the patient's chart, communicating with other professionals, and coordinating care. Greater than 50% of this time was spent in counseling or coordinating care with the patient regarding goals of hospitalization, psycho-education, and discharge planning needs.    Signed: Dina Rich OMS-4, Psychiatry Acting Intern   9/3/20244:27 PM

## 2023-02-12 NOTE — Group Note (Signed)
Recreation Therapy Group Note   Group Topic:Animal Assisted Therapy   Group Date: 02/12/2023 Start Time: 0950 End Time: 1030 Facilitators: Sereena Marando-McCall, LRT,CTRS Location: [address] Dayroom   Animal-Assisted Activity (AAA) Program Checklist/Progress Notes Patient Eligibility Criteria Checklist & Daily Group note for Rec Tx Intervention  AAA/T Program Assumption of Risk Form signed by Patient/ or Parent Legal Guardian Yes  Patient is free of allergies or severe asthma Yes  Patient reports no fear of animals Yes  Patient reports no history of cruelty to animals Yes  Patient understands his/her participation is voluntary Yes  Patient washes hands before animal contact Yes  Patient washes hands after animal contact Yes  Education: Hand Washing, Appropriate Animal Interaction   Education Outcome: Acknowledges education.    Affect/Mood: Appropriate   Participation Level: Moderate   Participation Quality: Independent   Behavior: Appropriate   Speech/Thought Process: Focused   Insight: Good   Judgement: Good   Modes of Intervention: Teaching laboratory technician   Patient Response to Interventions:  Attentive   Education Outcome:  Acknowledges education   Clinical Observations/Individualized Feedback: Pt was attentive and had some interaction with the therapy dog.      Plan: Continue to engage patient in RT group sessions 2-3x/week.   Meghan Edwards, LRT,CTRS 02/12/2023 12:01 PM

## 2023-02-12 NOTE — Group Note (Signed)
[facility] LCSW Group Therapy Note   Group Date: 02/12/2023 Start Time: 1100 End Time: 1145  Type of Therapy/Topic:  Group Therapy:  Feelings about Diagnosis  Participation Level:  Did Not Attend      Description of Group:    This group will allow patients to explore their thoughts and feelings about diagnoses they have received. Patients will be guided to explore their level of understanding and acceptance of these diagnoses. Facilitator will encourage patients to process their thoughts and feelings about the reactions of others to their diagnosis, and will guide patients in identifying ways to discuss their diagnosis with significant others in their lives. This group will be process-oriented, with patients participating in exploration of their own experiences as well as giving and receiving support and challenge from other group members.   Therapeutic Goals: 1. Patient will demonstrate understanding of diagnosis as evidence by identifying two or more symptoms of the disorder:  2. Patient will be able to express two feelings regarding the diagnosis 3. Patient will demonstrate ability to communicate their needs through discussion and/or role plays  Summary of Patient Progress:        Therapeutic Modalities:   Cognitive Behavioral Therapy Brief Therapy Feelings Identification    Meghan Edwards S Malaya Cagley, LCSW

## 2023-02-12 NOTE — [facility] Counselor (Signed)
  02/12/2023  4:00 PM   Meghan Edwards  Type of note: Safety planning note   CSW spoke with Pts sister and completed safety planning. Sister stated she would be staying with Pt for a little while after discharge to help out and that she had no safety concerns about the Pt. Pts sister will also be the one to pick up Pt when discharging.   Signed:  Marya Landry MSW, LCSWA 02/12/2023  4:00 PM

## 2023-02-12 NOTE — [facility] Suicide Risk Assessment (Signed)
[facility] INPATIENT:  Family/Significant Other Suicide Prevention Education  Suicide Prevention Education:  Education Completed; Maresa Irish (sister) - [phone], has been identified by the patient as the family member/significant other with whom the patient will be residing, and identified as the person(s) who will aid the patient in the event of a mental health crisis (suicidal ideations/suicide attempt).  With written consent from the patient, the family member/significant other has been provided the following suicide prevention education, prior to the and/or following the discharge of the patient.  The suicide prevention education provided includes the following: Suicide risk factors Suicide prevention and interventions National Suicide Hotline telephone number [facility] assessment telephone number [facility] Emergency Assistance 911 [facility] and/or Residential Mobile Crisis Unit telephone number  Request made of family/significant other to: Remove weapons (e.g., guns, rifles, knives), all items previously/currently identified as safety concern.   Remove drugs/medications (over-the-counter, prescriptions, illicit drugs), all items previously/currently identified as a safety concern.  The family member/significant other verbalizes understanding of the suicide prevention education information provided.  The family member/significant other agrees to remove the items of safety concern listed above.  I[employer]ell [employer] 02/12/2023, 3:59 PM
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
## 2023-02-12 NOTE — Progress Notes (Addendum)
D. Pt has been visible in the milieu, observed interacting well with peers and attending groups. Pt is pleasant upon approach, denied SI/HI and A/VH Per pt's self inventory, pt rated her depression,hopelessness and anxiety all 0's today. Pt's stated goal is "to stop over-thinking, and relax and focus on me and what I need/want." A. Labs and vitals monitored. Pt supported emotionally and encouraged to express concerns and ask questions.   R. Pt remains safe with 15 minute checks. Will continue POC.    02/12/23 0900  Psych Admission Type (Psych Patients Only)  Admission Status Involuntary  Psychosocial Assessment  Patient Complaints Depression  Eye Contact Brief  Facial Expression Sad  Affect Sad  Speech Logical/coherent  Interaction Assertive  Motor Activity Other (Comment) (steady gait)  Appearance/Hygiene In scrubs  Behavior Characteristics Cooperative;Appropriate to situation  Mood Depressed  Thought Process  Coherency WDL  Content WDL  Delusions None reported or observed  Perception WDL  Hallucination None reported or observed  Judgment Poor  Confusion None  Danger to Self  Current suicidal ideation? Denies  Agreement Not to Harm Self Yes  Description of Agreement agreed to contact staff before acting on harmful thoughts  Danger to Others  Danger to Others None reported or observed

## 2023-02-12 NOTE — Plan of Care (Signed)

## 2023-02-12 NOTE — [facility] Group Notes (Signed)
[facility] Group Notes:  (Nursing/MHT/Case Management/Adjunct)  Date:  02/12/2023  Time:  10:52 AM  Type of Therapy:  Group Therapy  Participation Level:  Adult Psychoeducational Group Note  Date:  02/12/2023 Time:  10:53 AM  Group Topic/Focus:  Goals Group:   The focus of this group is to help patients establish daily goals to achieve during treatment and discuss how the patient can incorporate goal setting into their daily lives to aide in recovery.  Participation Level:  Active  Participation Quality:  Appropriate  Affect:  Appropriate  Cognitive:  Appropriate  Insight: Lacking  Engagement in Group:  Lacking  Modes of Intervention:  Discussion  Additional Comments:

## 2023-02-13 ENCOUNTER — Encounter (HOSPITAL_COMMUNITY): Payer: Self-pay

## 2023-02-13 DIAGNOSIS — F4323 Adjustment disorder with mixed anxiety and depressed mood: Secondary | ICD-10-CM | POA: Diagnosis not present

## 2023-02-13 MED ORDER — FERROUS SULFATE 325 (65 FE) MG PO TABS
325.0000 mg | ORAL_TABLET | Freq: Every day | ORAL | Status: DC
  Administered 2023-02-14: 325 mg via ORAL
  Filled 2023-02-13 (×2): qty 1
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
## 2023-02-13 NOTE — Progress Notes (Signed)
   02/13/23 1610  15 Minute Checks  Location Bedroom  Visual Appearance Calm  Behavior Composed  Sleep (Behavioral Health Patients Only)  Calculate sleep? (Click Yes once per 24 hr at 0600 safety check) Yes  Documented sleep last 24 hours 9

## 2023-02-13 NOTE — Progress Notes (Signed)
   02/13/23 0900  Psych Admission Type (Psych Patients Only)  Admission Status Involuntary  Psychosocial Assessment  Patient Complaints None  Eye Contact Fair  Facial Expression Flat  Affect Flat  Speech Logical/coherent  Interaction Assertive  Motor Activity Other (Comment) (WDL)  Appearance/Hygiene Unremarkable  Behavior Characteristics Cooperative  Mood Depressed  Thought Process  Coherency WDL  Content WDL  Delusions None reported or observed  Perception WDL  Hallucination None reported or observed  Judgment Poor  Confusion None  Danger to Self  Current suicidal ideation? Denies  Agreement Not to Harm Self Yes  Description of Agreement verbal  Danger to Others  Danger to Others None reported or observed

## 2023-02-13 NOTE — Group Note (Signed)
Recreation Therapy Group Note   Group Topic:Other  Group Date: 02/13/2023 Start Time: 1406 End Time: 1448 Facilitators: Audelia Knape-McCall, LRT,CTRS Location: [address] Dayroom   Activity Description/Intervention: Therapeutic Drumming. Patients with peers and staff were given the opportunity to engage in a leader facilitated [facility] Group Empowerment Drumming Circle with staff from the [employer], in partnership with [employer]. Teaching laboratory technician and trained [employer], Theodoro Doing leading with LRT observing and documenting intervention and pt response. This evidenced-based practice targets 7 areas of health and wellbeing in the human experience including: stress-reduction, exercise, self-expression, camaraderie/support, nurturing, spirituality, and music-making (leisure).    Goal Area(s) Addresses:  Patient will engage in pro-social way in music group.  Patient will follow directions of drum leader on the first prompt. Patient will demonstrate no behavioral issues during group.  Patient will identify if a reduction in stress level occurs as a result of participation in therapeutic drum circle.     Education: Leisure exposure, Pharmacologist, Musical expression, Discharge Planning   Affect/Mood: N/A   Participation Level: Did not attend    Clinical Observations/Individualized Feedback:     Plan: Continue to engage patient in RT group sessions 2-3x/week.   Aryanna Shaver-McCall, LRT,CTRS  02/13/2023 3:36 PM

## 2023-02-13 NOTE — Progress Notes (Signed)
   02/12/23 2150  Psych Admission Type (Psych Patients Only)  Admission Status Involuntary  Psychosocial Assessment  Patient Complaints None  Eye Contact Fair  Facial Expression Flat  Affect Flat  Speech Logical/coherent  Interaction Assertive  Motor Activity Other (Comment) (WNL)  Appearance/Hygiene Unremarkable  Behavior Characteristics Cooperative;Appropriate to situation  Mood Depressed  Thought Process  Coherency WDL  Content WDL  Delusions None reported or observed  Perception WDL  Hallucination None reported or observed  Judgment Impaired  Confusion None  Danger to Self  Current suicidal ideation? Denies  Agreement Not to Harm Self Yes  Description of Agreement verbal  Danger to Others  Danger to Others None reported or observed   Pt was offered support and encouragement.  Q 15 minute checks were done for safety. Pt attended group and interacts with peers and staff. Pt has no complaints.Pt receptive to treatment and safety maintained on unit.

## 2023-02-13 NOTE — BH IP Treatment Plan (Signed)
Interdisciplinary Treatment and Diagnostic Plan Initial  02/13/2023 Time of Session: 1055 Meghan Edwards MRN: [id number]  Principal Diagnosis: Adjustment disorder with mixed anxiety and depressed mood  Secondary Diagnoses: Principal Problem:   Adjustment disorder with mixed anxiety and depressed mood   Current Medications:  Current Facility-Administered Medications  Medication Dose Route Frequency Provider Last Rate Last Admin   acetaminophen (TYLENOL) tablet 650 mg  650 mg Oral Q6H PRN Dahlia Byes C, NP       alum & mag hydroxide-simeth (MAALOX/MYLANTA) 200-200-20 MG/5ML suspension 30 mL  30 mL Oral Q4H PRN Welford Roche, Josephine C, NP       diphenhydrAMINE (BENADRYL) capsule 50 mg  50 mg Oral TID PRN Earney Navy, NP       Or   diphenhydrAMINE (BENADRYL) injection 50 mg  50 mg Intramuscular TID PRN Earney Navy, NP       [START ON 02/14/2023] ferrous sulfate tablet 325 mg  325 mg Oral Q breakfast Massengill, Nathan, MD       haloperidol (HALDOL) tablet 5 mg  5 mg Oral TID PRN Dahlia Byes C, NP       Or   haloperidol lactate (HALDOL) injection 5 mg  5 mg Intramuscular TID PRN Earney Navy, NP       hydrOXYzine (ATARAX) tablet 25 mg  25 mg Oral TID PRN Massengill, Harrold Donath, MD       LORazepam (ATIVAN) tablet 2 mg  2 mg Oral TID PRN Earney Navy, NP       Or   LORazepam (ATIVAN) injection 2 mg  2 mg Intramuscular TID PRN Earney Navy, NP       traZODone (DESYREL) tablet 50 mg  50 mg Oral QHS PRN Massengill, Harrold Donath, MD       PTA Medications: Medications Prior to Admission  Medication Sig Dispense Refill Last Dose   ferrous sulfate 325 (65 FE) MG EC tablet Take 325 mg by mouth daily as needed.      acetaminophen (TYLENOL) 325 MG tablet Take 650 mg by mouth every 6 (six) hours as needed for mild pain or headache.      ibuprofen (ADVIL) 200 MG tablet Take 400 mg by mouth every 4 (four) hours as needed for fever, headache or moderate pain.        Patient Stressors:    Patient Strengths:    Treatment Modalities: Medication Management, Group therapy, Case management,  1 to 1 session with clinician, Psychoeducation, Recreational therapy.   Physician Treatment Plan for Primary Diagnosis: Adjustment disorder with mixed anxiety and depressed mood Long Term Goal(s): Improvement in symptoms so as ready for discharge   Short Term Goals: Ability to identify changes in lifestyle to reduce recurrence of condition will improve Ability to verbalize feelings will improve Ability to disclose and discuss suicidal ideas Ability to demonstrate self-control will improve Ability to identify and develop effective coping behaviors will improve Ability to identify triggers associated with substance abuse/mental health issues will improve  Medication Management: Evaluate patient's response, side effects, and tolerance of medication regimen.  Therapeutic Interventions: 1 to 1 sessions, Unit Group sessions and Medication administration.  Evaluation of Outcomes: Progressing  Physician Treatment Plan for Secondary Diagnosis: Principal Problem:   Adjustment disorder with mixed anxiety and depressed mood  Long Term Goal(s): Improvement in symptoms so as ready for discharge   Short Term Goals: Ability to identify changes in lifestyle to reduce recurrence of condition will improve Ability to verbalize feelings will improve Ability  to disclose and discuss suicidal ideas Ability to demonstrate self-control will improve Ability to identify and develop effective coping behaviors will improve Ability to identify triggers associated with substance abuse/mental health issues will improve     Medication Management: Evaluate patient's response, side effects, and tolerance of medication regimen.  Therapeutic Interventions: 1 to 1 sessions, Unit Group sessions and Medication administration.  Evaluation of Outcomes: Progressing   RN Treatment Plan for  Primary Diagnosis: Adjustment disorder with mixed anxiety and depressed mood Long Term Goal(s): Knowledge of disease and therapeutic regimen to maintain health will improve  Short Term Goals: Ability to remain free from injury will improve, Ability to verbalize frustration and anger appropriately will improve, Ability to demonstrate self-control, Ability to participate in decision making will improve, Ability to verbalize feelings will improve, Ability to disclose and discuss suicidal ideas, Ability to identify and develop effective coping behaviors will improve, and Compliance with prescribed medications will improve  Medication Management: RN will administer medications as ordered by provider, will assess and evaluate patient's response and provide education to patient for prescribed medication. RN will report any adverse and/or side effects to prescribing provider.  Therapeutic Interventions: 1 on 1 counseling sessions, Psychoeducation, Medication administration, Evaluate responses to treatment, Monitor vital signs and CBGs as ordered, Perform/monitor CIWA, COWS, AIMS and Fall Risk screenings as ordered, Perform wound care treatments as ordered.  Evaluation of Outcomes: Progressing   LCSW Treatment Plan for Primary Diagnosis: Adjustment disorder with mixed anxiety and depressed mood Long Term Goal(s): Safe transition to appropriate next level of care at discharge, Engage patient in therapeutic group addressing interpersonal concerns.  Short Term Goals: Engage patient in aftercare planning with referrals and resources, Increase social support, Increase ability to appropriately verbalize feelings, Increase emotional regulation, Facilitate acceptance of mental health diagnosis and concerns, Facilitate patient progression through stages of change regarding substance use diagnoses and concerns, Identify triggers associated with mental health/substance abuse issues, and Increase skills for wellness and  recovery  Therapeutic Interventions: Assess for all discharge needs, 1 to 1 time with Social worker, Explore available resources and support systems, Assess for adequacy in community support network, Educate family and significant other(s) on suicide prevention, Complete Psychosocial Assessment, Interpersonal group therapy.  Evaluation of Outcomes: Progressing   Progress in Treatment: Attending groups: Yes. Participating in groups: Yes. Taking medication as prescribed: Yes. Toleration medication: Yes. Family/Significant other contact made: Yes, individual(s) contacted:   Gineen Effinger (sister) - [phone] Patient understands diagnosis: Yes. Discussing patient identified problems/goals with staff: Yes. Medical problems stabilized or resolved: Yes. Denies suicidal/homicidal ideation: Yes. Issues/concerns per patient self-inventory: Yes. Other: N/A  New problem(s) identified: No, Describe:  None Reported  New Short Term/Long Term Goal(s): medication stabilization, elimination of SI thoughts, development of comprehensive mental wellness plan.   Patient Goals:  Coping Skills  Discharge Plan or Barriers: Patient recently admitted. CSW will continue to follow and assess for appropriate referrals and possible discharge planning.   Reason for Continuation of Hospitalization: Anxiety Depression Medication stabilization Suicidal ideation Withdrawal symptoms  Estimated Length of Stay: 3-7 Days  Last 3 [place] Suicide Severity Risk Score: Flowsheet Row [facility] (Current) [facility] from [facility]/10/2023 in [facility] Department [facility] ED from 12/21/2022 in [facility] Emergency Department at [facility]  C-SSRS RISK CATEGORY High Risk High Risk No Risk       Last PHQ 2/9 Scores:     No data to display  detox, medication management for mood stabilization; elimination of SI thoughts;  development of comprehensive mental wellness/sobriety plan    Scribe for Treatment Team: Ane Payment, LCSW 02/13/2023 2:14 PM

## 2023-02-13 NOTE — BHH Suicide Risk Assessment (Addendum)
[facility] Admission Suicide Risk Assessment   Nursing information obtained from:  Patient Demographic factors:  Living alone Current Mental Status:  NA Loss Factors:  Loss of significant relationship, Financial problems / change in socioeconomic status Historical Factors:  Prior suicide attempts Risk Reduction Factors:  Positive social support  Total Time spent with patient: 30 minutes Principal Problem: Adjustment disorder with mixed anxiety and depressed mood Diagnosis:  Principal Problem:   Adjustment disorder with mixed anxiety and depressed mood  Subjective Data: See H&P pt contradiciting self some, somewhat difficult to assess suicide risk, at times saying nothing is wrong, then saying that conversation with romantic partner was straw that broke camel's back (implying there were pre-existing stressors). She did drive to bridge to jump, having suicidal thoughts with plan.   Continued Clinical Symptoms:    The "Alcohol Use Disorders Identification Test", Guidelines for Use in Primary Care, Second Edition.  World Science writer [facility]). Score between 0-7:  no or low risk or alcohol related problems. Score between 8-15:  moderate risk of alcohol related problems. Score between 16-19:  high risk of alcohol related problems. Score 20 or above:  warrants further diagnostic evaluation for alcohol dependence and treatment.   CLINICAL FACTORS:   Severe Anxiety and/or Agitation Unstable or Poor Therapeutic Relationship    Psychiatric Specialty Exam:  Presentation  General Appearance: No data recorded Eye Contact: Good  Speech: Normal Rate; Clear and Coherent  Speech Volume: Normal  Handedness:No data recorded  Mood and Affect  Mood: Anxious  Affect: Full Range; Appropriate   Thought Process  Thought Processes: Linear  Descriptions of Associations:Intact  Orientation:Full (Time, Place and Person)  Thought Content:Logical  History of  Schizophrenia/Schizoaffective disorder:No  Duration of Psychotic Symptoms:No data recorded Hallucinations:Hallucinations: None  Ideas of Reference:None  Suicidal Thoughts:Suicidal Thoughts: No  Homicidal Thoughts:Homicidal Thoughts: No   Sensorium  Memory: Immediate Good; Recent Good; Remote Good  Judgment: Intact  Insight: Fair   Chartered certified accountant: Fair  Attention Span: Fair  Recall:No data recorded Fund of Knowledge:No data recorded Language:No data recorded  Psychomotor Activity  Psychomotor Activity: Psychomotor Activity: Normal   Assets  Assets:No data recorded  Sleep  Sleep: Sleep: Fair    Physical Exam: Physical Exam See H&P  ROS See H&P   Blood pressure 121/76, pulse 78, temperature 97.8 F (36.6 C), temperature source Oral, resp. rate 14, height 5\' 5"  (1.651 m), weight (!) 139.3 kg, SpO2 100%. Body mass index is 51.09 kg/m.   COGNITIVE FEATURES THAT CONTRIBUTE TO RISK:  None    SUICIDE RISK:   Moderate:  Frequent suicidal ideation with limited intensity, and duration, some specificity in terms of plans, no associated intent, good self-control, limited dysphoria/symptomatology, some risk factors present, and identifiable protective factors, including available and accessible social support.  PLAN OF CARE: See H&P   I certify that inpatient services furnished can reasonably be expected to improve the patient's condition.   Cristy Hilts, MD 02/13/2023, 7:43 AM

## 2023-02-13 NOTE — Progress Notes (Signed)
Chaplain met with Meghan Edwards to discuss advance care planning.  She would like to assign her sister as her health care poa.  Chaplain explained that it will need to be notarized and provided education about how to do this once she is discharged.   Chaplain assessed for other needs and she stated that she is just ready to go home.  She is open to therapy and wants a referral.  Chaplain encouraged her to speak with her social worker about this.  [address], [facility] Pager, [phone]

## 2023-02-13 NOTE — BHH Group Notes (Signed)
Patient attended and participated in the NA group. ?

## 2023-02-13 NOTE — Plan of Care (Signed)
  Problem: Activity: Goal: Interest or engagement in activities will improve Outcome: Progressing   Problem: Coping: Goal: Ability to verbalize frustrations and anger appropriately will improve Outcome: Progressing   Problem: Safety: Goal: Periods of time without injury will increase Outcome: Progressing   

## 2023-02-13 NOTE — BHH Group Notes (Signed)
Spiritual care group on grief and loss facilitated by Chaplain Dyanne Carrel, B[facility]  Group Goal: Support / Education around grief and loss  Members engage in facilitated group support and psycho-social education.  Group Description:  Following introductions and group rules, group members engaged in facilitated group dialogue and support around topic of loss, with particular support around experiences of loss in their lives. Group Identified types of loss (relationships / self / things) and identified patterns, circumstances, and changes that precipitate losses. Reflected on thoughts / feelings around loss, normalized grief responses, and recognized variety in grief experience. Group encouraged individual reflection on safe space and on the coping skills that they are already utilizing.  Group drew on Adlerian / Rogerian and narrative framework  Patient Progress: Meghan Edwards attended group and actively engaged and participated in group conversation.  Her comments contributed positively to the conversation and demonstrated good insight.

## 2023-02-14 DIAGNOSIS — F4323 Adjustment disorder with mixed anxiety and depressed mood: Secondary | ICD-10-CM

## 2023-02-14 NOTE — Discharge Instructions (Signed)
Recommend that patient works first shift.    -Follow-up with your outpatient psychiatric provider -instructions on appointment date, time, and address (location) are provided to you in discharge paperwork.  -Take your psychiatric medications as prescribed at discharge - instructions are provided to you in the discharge paperwork  -Follow-up with outpatient primary care doctor and other specialists -for management of preventative medicine and any chronic medical disease.  -Recommend abstinence from alcohol, tobacco, and other illicit drug use at discharge.   -If your psychiatric symptoms recur, worsen, or if you have side effects to your psychiatric medications, call your outpatient psychiatric provider, 911, [phone] or go to the nearest emergency department.  -If suicidal thoughts occur, call your outpatient psychiatric provider, 911, [phone] or go to the nearest emergency department.  Naloxone (Narcan) can help reverse an overdose when given to the victim quickly.  [facility] offers free naloxone kits and instructions/training on its use.  Add naloxone to your first aid kit and you can help save a life.   Pick up your free kit at the following locations:   [place]:  [facility], [address] [place] [address] [phone]) [facility] [address] [place] [address] [phone]) [facility] [address] [place] [address]  [place]: [facility] [place] [address] [place] [address] ([phone]) [facility] [address] [place] [address] [phone])

## 2023-02-14 NOTE — BHH Counselor (Signed)
  02/14/2023  11:04 AM   Vivien Presto  Type of note: discharge/Safety planning update   CSW spoke with Pts sister, Yashica Resko (sister) - [phone], and confirmed that the Pts gun would be removed from the house.  Signed:  Marya Landry MSW, LCSWA 02/14/2023  11:04 AM
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
## 2023-02-14 NOTE — Progress Notes (Signed)
Pt discharged at this time. Pt removed all belongings and verbalized understanding of discharge instructions. Pt denies SI/HI/AVH.

## 2023-02-14 NOTE — Progress Notes (Signed)
  [facility] Adult Case Management Discharge Plan :  Will you be returning to the same living situation after discharge:  Yes,  Pt will be returning to her own home.  At discharge, do you have transportation home?: Yes,  Pt will be picked up by a friend.  Do you have the ability to pay for your medications: No.  Release of information consent forms completed and in the chart;  Patient's signature needed at discharge.  Patient to Follow up at:  Follow-up Information     [facility]. Go to.   Specialty: Behavioral Health Why: Please go to this provider for an assessment, to obtain therapy and medication management services.  For fastest service, please go on Monday through Friday, arrive at 7:00 am for same day service. Contact information: [address] [place] [address] [phone]                Next level of care provider has access to [facility] Link:no  Safety Planning and Suicide Prevention discussed: Yes,  C - Alexyss Bourque (sister) - [phone]     Has patient been referred to the Quitline?: Patient refused referral for treatment  Patient has been referred for addiction treatment: No known substance use disorder.  Izell Freeborn, LCSW 02/14/2023, 9:25 AM

## 2023-02-14 NOTE — [facility] Counselor (Signed)
  02/14/2023  9:27 AM   Meghan Edwards  Type of note: Discharge note   Pt will be leaving today and picked up by a friend around 11am.  Signed:  Marya Landry MSW, LCSWA 02/14/2023  9:27 AM

## 2023-02-14 NOTE — Progress Notes (Signed)
   02/14/23 0559  15 Minute Checks  Location Bedroom  Visual Appearance Calm  Behavior Composed  Sleep (Behavioral Health Patients Only)  Calculate sleep? (Click Yes once per 24 hr at 0600 safety check) Yes  Documented sleep last 24 hours 7.5

## 2023-02-14 NOTE — Group Note (Signed)
Date:  02/14/2023 Time:  11:18 AM  Group Topic/Focus:  Goals Group:   The focus of this group is to help patients establish daily goals to achieve during treatment and discuss how the patient can incorporate goal setting into their daily lives to aide in recovery.    Participation Level:  Active  Participation Quality:  Appropriate  Affect:  Appropriate  Cognitive:  Appropriate  Insight: Appropriate  Engagement in Group:  Engaged  Modes of Intervention:  Discussion  Additional Comments:     Reymundo Poll 02/14/2023, 11:18 AM

## 2023-02-14 NOTE — Progress Notes (Signed)
   02/13/23 2200  Psych Admission Type (Psych Patients Only)  Admission Status Involuntary  Psychosocial Assessment  Patient Complaints None  Eye Contact Fair  Facial Expression Flat  Affect Appropriate to circumstance  Speech Logical/coherent  Interaction Assertive  Motor Activity Other (Comment) (WNL)  Appearance/Hygiene Unremarkable  Behavior Characteristics Cooperative;Appropriate to situation  Mood Anxious;Pleasant  Thought Process  Coherency WDL  Content WDL  Delusions None reported or observed  Perception WDL  Hallucination None reported or observed  Judgment Impaired  Confusion None  Danger to Self  Current suicidal ideation? Denies  Agreement Not to Harm Self Yes  Description of Agreement verbal  Danger to Others  Danger to Others None reported or observed   Pt was offered support and encouragement. Q 15 minute checks were done for safety. Pt attended group, interacts well with staff and peers. Pt has no complaints.Pt receptive to treatment and safety maintained on unit.

## 2023-02-14 NOTE — [facility] Suicide Risk Assessment (Signed)
[facility] Discharge Suicide Risk Assessment   Principal Problem: Adjustment disorder with mixed anxiety and depressed mood Discharge Diagnoses: Principal Problem:   Adjustment disorder with mixed anxiety and depressed mood   Total Time spent with patient: 20 minutes   Meghan Edwards is a 32 y.o. female  with a past psychiatric history of unspecified depressive disorder. Patient initially arrived to [facility] on 02/10/23 for suicidal ideation with intent and plan, and admitted to [facility] under IVC on 02/12/23 for acute suicidal or self-harming behaviors. PMHx is significant for anemia secondary to heavy/prolonged menstrual bleeding.     During the patient's hospitalization, patient had extensive initial psychiatric evaluation, and follow-up psychiatric evaluations every day.   Psychiatric diagnoses provided upon initial assessment: Adjustment Disorder with mixed anxiety and depressed mood    Patient's psychiatric medications were adjusted on admission:  Pt declined starting psychiatric medication   During the hospitalization, other adjustments were made to the patient's psychiatric medication regimen: none   Patient's care was discussed during the interdisciplinary team meeting every day during the hospitalization.    Gradually, patient started adjusting to milieu. The patient was evaluated each day by a clinical provider to ascertain response to treatment. Improvement was noted by the patient's report of decreasing symptoms, improved sleep and appetite, affect, behavior, and participation in unit programming.  Patient was asked each day to complete a self inventory noting mood, mental status, pain, new symptoms, anxiety and concerns.     Symptoms were reported as significantly decreased or resolved completely by discharge. On day of discharge, the patient reports that their mood is stable. The patient denied having suicidal thoughts for more than 48 hours prior to discharge.  Patient denies having homicidal  thoughts.  Patient denies having auditory hallucinations.  Patient denies any visual hallucinations or other symptoms of psychosis.   The patient reports their target psychiatric symptoms of overwhelm and suicidal thoughts, all responded well to the psychiatric medications, and the patient reports overall benefit other psychiatric hospitalization. Supportive psychotherapy was provided to the patient. The patient also participated in regular group therapy while hospitalized. Coping skills, problem solving as well as relaxation therapies were also part of the unit programming.   Labs were reviewed with the patient, and abnormal results were discussed with the patient.   The patient is able to verbalize their individual safety plan to this provider.  # It is recommended to the patient to follow up with your outpatient psychiatric provider and PCP.   # It was discussed with the patient, the impact of alcohol, drugs, tobacco have been there overall psychiatric and medical wellbeing, and total abstinence from substance use was recommended the patient.ed.   # Given opportunity to ask questions. Appears to feel comfortable with discharge.    # In the event of worsening symptoms, the patient is instructed to call the crisis hotline, 911 and or go to the nearest ED for appropriate evaluation and treatment of symptoms. To follow-up with primary care provider for other medical issues, concerns and or health care needs   # Patient was discharged to care of friend, with a plan to follow up as noted below. Per SW, pt's sister will secure firearm. Per pt, [employer] has firearm.      Psychiatric Specialty Exam  Presentation  General Appearance:  Appropriate for Environment; Casual; Fairly Groomed  Eye Contact: Good  Speech: Normal Rate; Clear and Coherent  Speech Volume: Normal  Handedness:No data recorded  Mood and Affect  Mood: Euthymic  Duration  of Depression Symptoms: Greater than two  weeks  Affect: Appropriate; Congruent; Full Range   Thought Process  Thought Processes: Linear  Descriptions of Associations:Intact  Orientation:Full (Time, Place and Person)  Thought Content:Logical  History of Schizophrenia/Schizoaffective disorder:No  Duration of Psychotic Symptoms:No data recorded Hallucinations:Hallucinations: None  Ideas of Reference:None  Suicidal Thoughts:Suicidal Thoughts: No  Homicidal Thoughts:Homicidal Thoughts: No   Sensorium  Memory: Immediate Good; Recent Good; Remote Good  Judgment: Fair  Insight: Fair   Art therapist  Concentration: Good  Attention Span: Good  Recall: Good  Fund of Knowledge: Good  Language: Good   Psychomotor Activity  Psychomotor Activity: Psychomotor Activity: Normal   Assets  Assets:No data recorded  Sleep  Sleep: Sleep: Fair   Physical Exam: Physical Exam See discharge summary  ROS See discharge summary   Blood pressure (!) 145/90, pulse 79, temperature 97.8 F (36.6 C), temperature source Oral, resp. rate 16, height 5\' 5"  (1.651 m), weight (!) 139.3 kg, SpO2 100%. Body mass index is 51.09 kg/m.  Mental Status Per Nursing Assessment::   On Admission:  NA  Demographic factors:  Living alone Loss Factors:  Loss of significant relationship, Financial problems / change in socioeconomic status Historical Factors:  Prior suicide attempts Risk Reduction Factors:  Positive social support  Continued Clinical Symptoms:  Mood is stable. Denying SI.   Cognitive Features That Contribute To Risk:  None    Suicide Risk:  Mild:  There are no identifiable suicide plans, no associated intent, mild dysphoria and related symptoms, good self-control (both objective and subjective assessment), few other risk factors, and identifiable protective factors, including available and accessible social support.    Follow-up Information     [facility]. Go to.    Specialty: Behavioral Health Why: Please go to this provider for an assessment, to obtain therapy and medication management services.  For fastest service, please go on Monday through Friday, arrive at 7:00 am for same day service. Contact information: [phone] [place] [address] [phone]                Plan Of Care/Follow-up recommendations:   Activity: as tolerated  Diet: heart healthy  Other: -Follow-up with your outpatient psychiatric provider -instructions on appointment date, time, and address (location) are provided to you in discharge paperwork.  -Follow-up with outpatient primary care doctor and other specialists -for management of preventative medicine and chronic medical disease  -Recommend total abstinence from alcohol, tobacco, and other illicit drug use at discharge.   -If your psychiatric symptoms recur, worsen, or if you have side effects to your psychiatric medications, call your outpatient psychiatric provider, 911, [phone] or go to the nearest emergency department.  -If suicidal thoughts occur, immediately call your outpatient psychiatric provider, 911, [phone] or go to the nearest emergency department.   Cristy Hilts, MD 02/14/2023, 11:15 AM  Total Time Spent in Direct Patient Care:  I personally spent 35 minutes on the unit in direct patient care. The direct patient care time included face-to-face time with the patient, reviewing the patient's chart, communicating with other professionals, and coordinating care. Greater than 50% of this time was spent in counseling or coordinating care with the patient regarding goals of hospitalization, psycho-education, and discharge planning needs.   Phineas Inches, MD Psychiatrist

## 2023-02-14 NOTE — Plan of Care (Signed)
  Problem: Activity: Goal: Interest or engagement in activities will improve Outcome: Progressing   Problem: Safety: Goal: Periods of time without injury will increase Outcome: Progressing   

## 2023-10-03 DIAGNOSIS — F411 Generalized anxiety disorder: Secondary | ICD-10-CM | POA: Diagnosis not present

## 2023-10-03 DIAGNOSIS — F4310 Post-traumatic stress disorder, unspecified: Secondary | ICD-10-CM | POA: Diagnosis not present

## 2023-10-08 DIAGNOSIS — E611 Iron deficiency: Secondary | ICD-10-CM | POA: Diagnosis not present

## 2023-10-08 DIAGNOSIS — R0681 Apnea, not elsewhere classified: Secondary | ICD-10-CM | POA: Diagnosis not present

## 2023-10-08 DIAGNOSIS — G4726 Circadian rhythm sleep disorder, shift work type: Secondary | ICD-10-CM | POA: Diagnosis not present

## 2023-10-08 DIAGNOSIS — R0683 Snoring: Secondary | ICD-10-CM | POA: Diagnosis not present

## 2023-10-18 DIAGNOSIS — Z6841 Body Mass Index (BMI) 40.0 and over, adult: Secondary | ICD-10-CM | POA: Diagnosis not present

## 2023-10-18 DIAGNOSIS — F4310 Post-traumatic stress disorder, unspecified: Secondary | ICD-10-CM | POA: Diagnosis not present

## 2023-10-18 DIAGNOSIS — F411 Generalized anxiety disorder: Secondary | ICD-10-CM | POA: Diagnosis not present

## 2023-10-22 DIAGNOSIS — G4733 Obstructive sleep apnea (adult) (pediatric): Secondary | ICD-10-CM | POA: Diagnosis not present

## 2023-10-24 DIAGNOSIS — G4733 Obstructive sleep apnea (adult) (pediatric): Secondary | ICD-10-CM | POA: Diagnosis not present

## 2023-10-24 DIAGNOSIS — F332 Major depressive disorder, recurrent severe without psychotic features: Secondary | ICD-10-CM | POA: Diagnosis not present

## 2023-10-24 DIAGNOSIS — F4310 Post-traumatic stress disorder, unspecified: Secondary | ICD-10-CM | POA: Diagnosis not present

## 2023-10-24 DIAGNOSIS — F411 Generalized anxiety disorder: Secondary | ICD-10-CM | POA: Diagnosis not present

## 2024-05-12 DIAGNOSIS — F411 Generalized anxiety disorder: Secondary | ICD-10-CM | POA: Diagnosis not present

## 2024-05-12 DIAGNOSIS — F4310 Post-traumatic stress disorder, unspecified: Secondary | ICD-10-CM | POA: Diagnosis not present
